# Patient Record
Sex: Male | Born: 1970 | Race: Black or African American | Hispanic: No | Marital: Single | Smoking: Never smoker
Health system: Southern US, Community
[De-identification: ages and names within clinical notes are randomized; demographics above are authoritative.]

## PROBLEM LIST (undated history)

## (undated) DIAGNOSIS — F32A Depression, unspecified: Secondary | ICD-10-CM

## (undated) DIAGNOSIS — J449 Chronic obstructive pulmonary disease, unspecified: Secondary | ICD-10-CM

## (undated) DIAGNOSIS — F419 Anxiety disorder, unspecified: Secondary | ICD-10-CM

## (undated) DIAGNOSIS — F329 Major depressive disorder, single episode, unspecified: Secondary | ICD-10-CM

## (undated) DIAGNOSIS — E785 Hyperlipidemia, unspecified: Secondary | ICD-10-CM

## (undated) DIAGNOSIS — H547 Unspecified visual loss: Secondary | ICD-10-CM

## (undated) DIAGNOSIS — I739 Peripheral vascular disease, unspecified: Secondary | ICD-10-CM

## (undated) DIAGNOSIS — I10 Essential (primary) hypertension: Secondary | ICD-10-CM

## (undated) DIAGNOSIS — Z5189 Encounter for other specified aftercare: Secondary | ICD-10-CM

## (undated) DIAGNOSIS — M810 Age-related osteoporosis without current pathological fracture: Secondary | ICD-10-CM

## (undated) DIAGNOSIS — Z8673 Personal history of transient ischemic attack (TIA), and cerebral infarction without residual deficits: Secondary | ICD-10-CM

## (undated) DIAGNOSIS — Z89619 Acquired absence of unspecified leg above knee: Secondary | ICD-10-CM

## (undated) DIAGNOSIS — M1990 Unspecified osteoarthritis, unspecified site: Secondary | ICD-10-CM

## (undated) DIAGNOSIS — I7100 Dissection of unspecified site of aorta: Secondary | ICD-10-CM

## (undated) DIAGNOSIS — N189 Chronic kidney disease, unspecified: Secondary | ICD-10-CM

## (undated) HISTORY — DX: Chronic kidney disease, unspecified: N18.9

## (undated) HISTORY — DX: Unspecified visual loss: H54.7

## (undated) HISTORY — PX: ABDOMINAL SURGERY: SHX537

## (undated) HISTORY — DX: Encounter for other specified aftercare: Z51.89

## (undated) HISTORY — DX: Chronic obstructive pulmonary disease, unspecified: J44.9

## (undated) HISTORY — DX: Peripheral vascular disease, unspecified: I73.9

## (undated) HISTORY — DX: Acquired absence of unspecified leg above knee: Z89.619

## (undated) HISTORY — DX: Age-related osteoporosis without current pathological fracture: M81.0

## (undated) HISTORY — DX: Unspecified osteoarthritis, unspecified site: M19.90

## (undated) HISTORY — DX: Hyperlipidemia, unspecified: E78.5

## (undated) HISTORY — PX: OTHER SURGICAL HISTORY: SHX169

## (undated) HISTORY — DX: Dissection of unspecified site of aorta: I71.00

## (undated) HISTORY — DX: Personal history of transient ischemic attack (TIA), and cerebral infarction without residual deficits: Z86.73

---

## 2007-09-10 DIAGNOSIS — N2581 Secondary hyperparathyroidism of renal origin: Secondary | ICD-10-CM | POA: Insufficient documentation

## 2008-05-27 DIAGNOSIS — H5440 Blindness, one eye, unspecified eye: Secondary | ICD-10-CM | POA: Insufficient documentation

## 2008-06-09 DIAGNOSIS — G8220 Paraplegia, unspecified: Secondary | ICD-10-CM | POA: Insufficient documentation

## 2011-05-20 ENCOUNTER — Ambulatory Visit (HOSPITAL_COMMUNITY)
Admission: RE | Admit: 2011-05-20 | Discharge: 2011-05-20 | Disposition: A | Discharge status: Home / Self Care | Payer: Medicare Other | Source: Ambulatory Visit | Attending: Family Medicine | Admitting: Family Medicine

## 2011-05-20 DIAGNOSIS — S78119A Complete traumatic amputation at level between unspecified hip and knee, initial encounter: Secondary | ICD-10-CM | POA: Insufficient documentation

## 2011-05-20 DIAGNOSIS — R29898 Other symptoms and signs involving the musculoskeletal system: Secondary | ICD-10-CM | POA: Insufficient documentation

## 2011-05-20 DIAGNOSIS — Z5189 Encounter for other specified aftercare: Secondary | ICD-10-CM | POA: Insufficient documentation

## 2011-05-20 DIAGNOSIS — M24559 Contracture, unspecified hip: Secondary | ICD-10-CM | POA: Insufficient documentation

## 2011-05-20 DIAGNOSIS — R269 Unspecified abnormalities of gait and mobility: Secondary | ICD-10-CM | POA: Insufficient documentation

## 2011-05-20 DIAGNOSIS — M2560 Stiffness of unspecified joint, not elsewhere classified: Secondary | ICD-10-CM | POA: Insufficient documentation

## 2011-05-20 DIAGNOSIS — M6281 Muscle weakness (generalized): Secondary | ICD-10-CM | POA: Insufficient documentation

## 2011-05-20 NOTE — Progress Notes (Signed)
Physical Therapy Evaluation  Patient Details  Name: Robert Campbell MRN: [id number] Date of Birth: September 14, 1970  Today's Date: 05/20/2011 Time: [id number] Time Calculation (min): 88 min Visit#: 1  of 24   Re-eval: 06/19/11 Assessment Diagnosis: B AKA Surgical Date: 09/17/07 Prior Therapy: 9/09-12/09 Charge:  Evaluation; there ex x 42 Past Medical History: No past medical history on file. Past Surgical History: No past surgical history on file.  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  He was in the hospital for 9 months.  He still had a wound in his R stump from the amputation and was referred to the wound center for his wound care.  The patient states that he was at the wound center for a year and a half. 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 He had an antiseptic rod put in last October.   The patient was fitted for his prothesis a month and a half ago at  [facility] telephone [phone] fax [phone].   He states he was able to stand on his prosthetic legs for a short period of time.  He is now being referred to physical therapy to learn how to get up on his prosthetic legs.   How long can you sit comfortably?: The patient is in a wheelchair. How long can you stand comfortably?: unable How long can you walk comfortably?: unable. Pain Assessment Currently in Pain?: No/denies (Pain with the shrinkers.)  Precautions/Restrictions  Precautions Precautions: Fall Required Braces or Orthoses: Yes Other Brace/Splint: B prothesis Restrictions Weight Bearing Restrictions: No  Prior Functioning  Home Living Lives With: Family (grandmother) Receives Help From: Home health Type of Home: House Home Layout: One level Home Access: Level entry Bathroom Shower/Tub:  (Pt completes bed  baths.) Bathroom Toilet:  (bedside commode  and urinal.) Home Adaptive Equipment: Bedside commode/3-in-1;Electric Scooter;Hospital bed;Wheelchair - powered  Sensation/Coordination/Flexibility    Assessment RLE AROM (degrees) Right Hip Extension 0-30:  (PROM 8 degrees away from neutral) Right Hip Flexion 0-125: 15  (PROM 40 ) Right Hip ABduction 0-45: 0  (PROM 5) Right Hip ADduction 0-25: 0  RLE Strength Right Hip Flexion: 1/5 Right Hip Extension: 1/5 Right Hip ABduction: 1/5 Right Hip ADduction: 1/5 LLE AROM (degrees) Left Hip Extension 0-30:  (Passively 20 degrees away from neutral.) Left Hip Flexion 0-125: 25  (Passive 60 degrees) Left Hip ABduction 0-45: 0  (PROM 10) Left Hip ADduction 0-25: 0  LLE Strength Left Hip Flexion: 1/5 Left Hip Extension: 1/5 Left Hip ABduction: 2-/5 ( 2- in available range) Left Hip ADduction: 1/5  Exercise/Treatments    PT received PROM to B hips in all directions;   Supine AA hip flexion x 5 concentrating on being able to lift opposite hip without bringing the other hip into flexion as well, adduction x 5, and abduction.  side-lying hip extension.  Physical Therapy Assessment and Plan PT Assessment and Plan Clinical Impression Statement: Pt with significant contractures and weakness making using prothesis impractical at this time. Pt will benefit from skilled PT to improve the above and begin prosthetic training. Rehab Potential: Good Clinical Impairments Affecting Rehab Potential: contractures, weakness. PT Frequency: Min 3X/week PT Duration: 8 weeks PT Treatment/Interventions: Gait training;Therapeutic exercise;Patient/family education;Balance training PT Plan: work on bed mobility, transfers, PROM, strengthenging  Goals Home Exercise Program Pt will Perform Home Exercise Program: Independently PT Short Term Goals Time to Complete Short Term Goals: 2 weeks PT Short Term Goal 1: increase ROM by 10 degrees PT Short Term Goal 2:  increase strength by 1/2 grade. PT Long Term Goals Time to Complete Long Term Goals: 8 weeks PT Long Term Goal 1: Increase mm strength by one grade. PT Long Term Goal 2: increase ROM by 20- 30 degrees. Long Term Goal 3: able to come up onto prosthetic with min assist Long Term Goal 4: able to walk 30 ft with walker and min assist.  Problem List Patient Active Problem List  Diagnoses  . Stiffness of joint, not elsewhere classified, multiple sites  . Weakness of both legs  . Contracture of hip joint    PT - End of Session Equipment Utilized During Treatment: Gait belt Activity Tolerance: Patient tolerated treatment well General Behavior During Session: [facility] for tasks performed Cognition: [facility] for tasks performed   RUSSELL,[name] 05/20/2011, 11:39 AM  Physician Documentation Your signature is required to indicate approval of the treatment plan as stated above.  Please sign and either send electronically or make a copy of this report for your files and return this physician signed original.   Please mark one 1.__approve of plan  2. ___approve of plan with the following conditions.   ______________________________                                                          _____________________ Physician Signature                                                                                                             Date

## 2011-05-23 ENCOUNTER — Ambulatory Visit (HOSPITAL_COMMUNITY)
Admission: RE | Admit: 2011-05-23 | Discharge: 2011-05-23 | Disposition: A | Discharge status: Home / Self Care | Payer: Medicare Other | Source: Ambulatory Visit | Attending: Family Medicine | Admitting: Family Medicine

## 2011-05-23 NOTE — Progress Notes (Signed)
Physical Therapy Treatment Patient Details  Name: Robert Campbell MRN: [id number] Date of Birth: [birth year]  Today's Date: 05/23/2011 Time: [id number] Time Calculation (min): 64 min Visit#: 2  of 24   Re-eval: 06/19/11 Charges: Therex x 54'  Subjective: Symptoms/Limitations Symptoms: Pt reprots that he was a little sore after last session but not too bad. Pain Assessment Currently in Pain?: No/denies  Exercise/Treatments Hip Exercises Straight Leg Raises: AAROM;10 reps Hip Extension: 10 reps;AAROM Hip ABduction/ADduction: 10 reps;AAROM POE x 3' PROM to B hips all directions 3x30" each  Physical Therapy Assessment and Plan PT Assessment and Plan Clinical Impression Statement: Pt requires manual assistance to disassociate LE with therex. Pt with increased wt shift to R side with prone therex secondary to L hip flexion contracture. Pt requires assistance to complete all LE exercises. PROM performed to B hips in all directions to reduce contractures. Pt requires multimodal cueing to use hip mm rather than trunk mm to move LE. PT Treatment/Interventions: Therapeutic exercise PT Plan: Contionue to progress per PT POC.     Problem List Patient Active Problem List  Diagnoses  . Stiffness of joint, not elsewhere classified, multiple sites  . Weakness of both legs  . Contracture of hip joint    PT - End of Session Activity Tolerance: Patient tolerated treatment well General Behavior During Session: [facility] for tasks performed Cognition: [facility] for tasks performed  [name] 05/23/2011, 12:20 PM

## 2011-05-25 ENCOUNTER — Ambulatory Visit (HOSPITAL_COMMUNITY)
Admission: RE | Admit: 2011-05-25 | Discharge: 2011-05-25 | Disposition: A | Discharge status: Home / Self Care | Payer: Medicare Other | Source: Ambulatory Visit | Attending: Family Medicine | Admitting: Family Medicine

## 2011-05-25 NOTE — Progress Notes (Signed)
Physical Therapy Treatment Patient Details  Name: Robert Campbell MRN: [id number] Date of Birth: Dec 12, 1970  Today's Date: 05/25/2011 Time: [id number] Time Calculation (min): 57 min Visit#: 3  of 24   Re-eval: 06/19/11 Charges: Therex x 45' There act x 8'  Subjective: Symptoms/Limitations Symptoms: "I was a little sore after last time but I know I need it." Pain Assessment Currently in Pain?: No/denies   Mobility (including Balance) Bed Mobility Bed Mobility: Yes (Completed 5 times B to increase strength) Rolling Right: 4: Min assist;7: Independent Rolling Right Details (indicate cue type and reason): Pt requires min assist for first 2 tries then able to roll R independently. Rolling Left: 7: Independent     Exercise/Treatments Supine Straight Leg Raises: 10 reps;Both;AAROM Other Supine Knee Exercises: PROM to B hips abd and flexion Sidelying Hip ABduction: Both;15 reps Other Sidelying Knee Exercises: PROM to B for ext Prone  Hip Extension: 15 reps;Both Other Prone Exercises: POE x 3' at start of session Other Prone Exercises: Glute set 10x5"  Physical Therapy Assessment and Plan PT Assessment and Plan Clinical Impression Statement: Pt displyas increased strength and ROM this tx. Pt displays increased hip stability with sidelying exercises. Pt able to roll R independtently after 2 tries with min assist. PT Treatment/Interventions: Therapeutic exercise PT Plan: Continue to progress per PT POC.    Goals    Problem List Patient Active Problem List  Diagnoses  . Stiffness of joint, not elsewhere classified, multiple sites  . Weakness of both legs  . Contracture of hip joint    PT - End of Session Activity Tolerance: Patient tolerated treatment well General Behavior During Session: [facility] for tasks performed Cognition: [facility] for tasks performed  Robert Campbell 05/25/2011, 12:18 PM

## 2011-05-27 ENCOUNTER — Ambulatory Visit (HOSPITAL_COMMUNITY)
Admission: RE | Admit: 2011-05-27 | Discharge: 2011-05-27 | Disposition: A | Discharge status: Home / Self Care | Payer: Medicare Other | Source: Ambulatory Visit | Attending: Physical Therapy | Admitting: Physical Therapy

## 2011-05-27 DIAGNOSIS — R29898 Other symptoms and signs involving the musculoskeletal system: Secondary | ICD-10-CM

## 2011-05-27 DIAGNOSIS — M24559 Contracture, unspecified hip: Secondary | ICD-10-CM

## 2011-05-27 DIAGNOSIS — M2560 Stiffness of unspecified joint, not elsewhere classified: Secondary | ICD-10-CM

## 2011-05-27 NOTE — Progress Notes (Signed)
Physical Therapy Treatment Patient Details  Name: Robert Campbell MRN: [id number] Date of Birth: Feb 12, 1971  Today's Date: 05/27/2011 Time: 1102-1211 Time Calculation (min): 17 min Visit#: 4  of 24   Re-eval: 06/19/11    Subjective: Symptoms/Limitations Symptoms: Pt states he can tell that he is making progress. Pain Assessment Currently in Pain?: No/denies  Exercise/Treatments Supine Quad Sets: Limitations Quad Sets Limitations: Rolling R/L x 10 @; ab curl x 10 Straight Leg Raises: 10 reps;Both;AAROM Other Supine Knee Exercises: PROM to B hips abd and flexion Other Supine Knee Exercises: AAROM for flex/ext x 10 Sidelying Hip ABduction: Both;15 reps Hip ADduction: Both;10 reps Other Sidelying Knee Exercises: PROM to B for ext Other Sidelying Knee Exercises: AROM for flex/ext B Prone  Hip Extension: 15 reps;Both Other Prone Exercises: Press ups x 10 Other Prone Exercises: AAHip extension x 10@   Physical Therapy Assessment and Plan PT Assessment and Plan Clinical Impression Statement: Pt continues to display more ROM and strength; added rolling for obliques and ab curl for rectus able to do I now.  PT Plan: continue to progress;  begin sitting balance activities.    Goals    Problem List Patient Active Problem List  Diagnoses  . Stiffness of joint, not elsewhere classified, multiple sites  . Weakness of both legs  . Contracture of hip joint    PT - End of Session Activity Tolerance: Patient tolerated treatment well General Behavior During Session: [facility] for tasks performed Cognition: [facility] for tasks performed  RUSSELL,[name] 05/27/2011, 12:35 PM

## 2011-05-30 ENCOUNTER — Ambulatory Visit (HOSPITAL_COMMUNITY): Payer: Medicare Other | Admitting: *Deleted

## 2011-06-01 ENCOUNTER — Ambulatory Visit (HOSPITAL_COMMUNITY)
Admission: RE | Admit: 2011-06-01 | Discharge: 2011-06-01 | Disposition: A | Discharge status: Home / Self Care | Payer: Medicare Other | Source: Ambulatory Visit | Attending: Physical Therapy | Admitting: Physical Therapy

## 2011-06-01 ENCOUNTER — Ambulatory Visit (HOSPITAL_COMMUNITY)
Admission: RE | Admit: 2011-06-01 | Discharge: 2011-06-01 | Disposition: A | Discharge status: Home / Self Care | Payer: Medicare Other | Source: Ambulatory Visit | Attending: Specialist | Admitting: Specialist

## 2011-06-01 DIAGNOSIS — R6889 Other general symptoms and signs: Secondary | ICD-10-CM | POA: Insufficient documentation

## 2011-06-01 NOTE — Progress Notes (Signed)
Occupational Therapy Evaluation  Patient Details  Name: Robert Campbell MRN: [id number] Date of Birth: 1970-11-08  Today's Date: 06/01/2011 Time: [id number] Time Calculation (min): 20 min OT Eval 20' Visit#: 1  of 8   Re-eval: 06/29/11  Assessment Diagnosis: B AKA Next MD Visit: unknown Prior Therapy: no  Past Medical History: No past medical history on file. Past Surgical History: No past surgical history on file.  Subjective Symptoms/Limitations Symptoms: S:  I havent been able to get into the shower for 3 years. 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  He was in the hospital for 9 months.  He still had a wound in his R stump from the amputation and was referred to the wound center for his wound care.  The patient states that he was at the wound center for a year and a half. 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 He had an antiseptic rod put in last October.   The patient was fitted for his prothesis a month and a half ago at  the [facility] telephone [phone] fax [phone].   He states he was able to stand on his prosthetic legs for a short period of time.   Currently, he completes all BADLs from his bed with the assistance of a CNA.  He would like to be able to use his prosthetics and have greater I with his BADLs out of his bed. Pain Assessment Currently in Pain?: No/denies  Precautions/Restrictions  Precautions Precautions: Fall Required Braces or Orthoses: Yes Other Brace/Splint: B prothesis Restrictions Weight Bearing Restrictions: No  Prior Functioning  Home Living Lives With: Family (grandmother) Receives Help From: Personal care attendant (2.5 hours a day) Type of Home: House Home Layout: One level Home Access: Level entry Bathroom Shower/Tub:  (not accessible by  wheelchair) Bathroom Toilet:  (not accessible by wheelchair) Bathroom Accessibility: No Home Adaptive Equipment: Bedside commode/3-in-1;Hospital bed;Wheelchair - powered Prior Function Level of Independence: Independent with basic ADLs;Independent with homemaking with ambulation;Independent with transfers;Independent with gait (prior to amputation 3 years ago, since that time max pa) Able to Take Stairs?: No Driving: No Vocation: On disability  Assessment ADL Eating/Feeding: Independent Grooming: Set up (completes in bed) Upper Body Bathing: Simulated;Moderate assistance (he does front of body and peri area, aide does posterior bod) Upper Body Bathing Details (indicate cue type and reason): completes in bed Lower Body Bathing: Moderate assistance (he completes front of body, aide completes posterior) Where Assessed - Lower Body Bathing:  (completes in bed) Upper Body Dressing: Simulated;Set up Lower Body Dressing: Simulated;Set up Toileting - Clothing Manipulation: Simulated (completes pant pull in bed) Toileting - Hygiene: Set up ADL Comments:  (completes all ADLs in bed, unable to use shower or toilet) Vision - History Baseline Vision: No visual deficits Perception Perception: Within Functional Limits Praxis Praxis: Intact Cognition Overall Cognitive Status: Appears within functional limits for tasks assessed Arousal/Alertness: Awake/alert Orientation Level: Oriented X4 RUE Assessment RUE Assessment: Within Functional Limits RUE AROM (degrees) Overall AROM Right Upper Extremity: Within functional limits for tasks performed RUE Strength RUE Overall Strength: Within Functional Limits for tasks performed (5/5 strength but fatigues quickly) LUE Assessment LUE Assessment: Within Functional Limits LUE Strength LUE Overall Strength Comments: 5/5 strength but fatigues easily.  Occupational Therapy Assessment and Plan OT Assessment and Plan  Clinical Impression Statement: A:   Patient is completing all BADLs with assistance at bed level.  He will benefit from skilled OT intervention to improve his independence level to complete ADLs from wc level and with use of prosthetics. Rehab Potential: Good OT Frequency: Min 2X/week OT Duration: 4 weeks OT Treatment/Interventions: Self-care/ADL training;Therapeutic exercise;Patient/family education;Therapeutic activities (HEP:  Shake weight exercises to build upper body act tol.) OT Plan: P:  Skilled OT intervention to increase I with all BADLs, increase activity tolerance.  Treatment Plan:  Tband, wall wash, strengthening with free weights entire upper body, tub transfer, ADL retraining, cooking from wc level.   Goals Short Term Goals Time to Complete Short Term Goals: 2 weeks Short Term Goal 1: Patient will be educated on a home exercise program. Short Term Goal 2: Patient will complete tub transfer with tub transfer bench with mod pa. Short Term Goal 3: Patient will increase BUE sustained activity tolerance from fair + to good +. Short Term Goal 4: Patient will complete BADLs at wheelchair level 50% of the time. Long Term Goals Time to Complete Long Term Goals: 4 weeks Long Term Goal 1: Patient will increase BUE sustained activity tolerance to normal for increased independence with BADLs. Long Term Goal 2: Patient will complete tub transfer with tub transfer bench with SBA. Long Term Goal 3: Patient will complete ADLs from wheelchair level 80% of the time. Long Term Goal 4: Patient will don/doff prosthetics with min pa. Long Term Goal 5: Patient will utilize prosthetics to ambulate into bathroom for tub and toilet transfer with min pa. End of Session Patient Active Problem List  Diagnoses  . Stiffness of joint, not elsewhere classified, multiple sites  . Weakness of both legs  . Contracture of hip joint  . Other general symptoms   End of Session Activity Tolerance: Patient tolerated treatment well General Behavior  During Session: [facility] for tasks performed Cognition: [facility] for tasks performed  Time Calculation Start Time: [id number] Stop Time: 1107 Time Calculation (min): 20 min  Vangie Bicker, OTR/L  06/01/2011, 11:36 AM  Physician Documentation Your signature is required to indicate approval of the treatment plan as stated above.  Please sign and either send electronically or make a copy of this report for your files and return this physician signed original.  Please mark one 1.__approve of plan  2. ___approve of plan with the following conditions.   ______________________________                                                          _____________________ Physician Signature                                                                                                             Date

## 2011-06-01 NOTE — Progress Notes (Signed)
Physical Therapy Treatment Patient Details  Name: Robert Campbell MRN: [id number] Date of Birth: [birth year]  Today's Date: 06/01/2011 Time: 1110-1210 Time Calculation (min): 60 min Visit#: 5  of 24   Re-eval: 06/19/11 Charges: therex x 55'  Subjective: Symptoms/Limitations Symptoms: Pt reports that he has been doing his exercises at home. Pain Assessment Currently in Pain?: No/denies   Exercise/Treatments Seated Other Seated Knee Exercises: Sitting with UE assist 2x 1' Supine Straight Leg Raises: 15 reps;Both;AAROM Other Supine Knee Exercises: PROM to B hips abd and flexion Sidelying Hip ABduction: Both;15 reps Other Sidelying Knee Exercises: PROM to B for ext Prone  Hip Extension: 15 reps;Both Other Prone Exercises: Press ups x 10 Other Prone Exercises: POE x 3'   Physical Therapy Assessment and Plan PT Assessment and Plan Clinical Impression Statement: Pt continues to display gains in ROM and strength. Pt displays increased hip stability with SL exercises. Began sitting with UE assist to improve core stability. PT Treatment/Interventions: Therapeutic exercise PT Plan: Continue to progress per PT POC.     Problem List Patient Active Problem List  Diagnoses  . Stiffness of joint, not elsewhere classified, multiple sites  . Weakness of both legs  . Contracture of hip joint  . Other general symptoms    PT - End of Session Activity Tolerance: Patient tolerated treatment well General Behavior During Session: [facility] for tasks performed Cognition: [facility] for tasks performed  [name] 06/01/2011, 12:24 PM

## 2011-06-03 ENCOUNTER — Ambulatory Visit (HOSPITAL_COMMUNITY)
Admission: RE | Admit: 2011-06-03 | Discharge: 2011-06-03 | Disposition: A | Discharge status: Home / Self Care | Payer: Medicare Other | Source: Ambulatory Visit | Attending: Family Medicine | Admitting: Family Medicine

## 2011-06-03 NOTE — Progress Notes (Signed)
Physical Therapy Treatment Patient Details  Name: Geomar Toye MRN: [id number] Date of Birth: March 29, 1971  Today's Date: 06/03/2011 Time: [id number] Time Calculation (min): 62 min Visit#: 6  of 24   Re-eval: 06/20/11   charge:  There ex 56 Subjective: Symptoms/Limitations Symptoms: I'm sore Pain Assessment Currently in Pain?: No/denies  Exercise/Treatments Stretches   manual stretch for B hip in all directions   Seated Other Seated Knee Exercises: Sitting with UE assist 2x 1' Supine Straight Leg Raises: 15 reps;Both;AAROM Other Supine Knee Exercises: PROM to B hips abd and flexion Other Supine Knee Exercises: 20 straight ab crunch; 10 diagonal both r and l Sidelying Hip ABduction: Both;10 reps Hip ADduction: 10 reps Other Sidelying Knee Exercises: PROM to B for ext Other Sidelying Knee Exercises: hip flex/ext x 10 Prone  Hip Extension: 15 reps;Both Other Prone Exercises: Press ups x 10 Other Prone Exercises: POE x 3'   Physical Therapy Assessment and Plan PT Assessment and Plan Clinical Impression Statement: Pt continues to make small gains but will nees skilled physical therapy for prolong period of time.   Rehab Potential: Good PT Frequency: Min 3X/week PT Plan: Work on sit ups with therapist in front assisting with hands.  Give patient [name]'s number as he will go beyond the medicare cap.    Goals  good sitting balance; ability to don prothesis and walk in house.  Problem List Patient Active Problem List  Diagnoses  . Stiffness of joint, not elsewhere classified, multiple sites  . Weakness of both legs  . Contracture of hip joint  . Other general symptoms    PT - End of Session Activity Tolerance: Patient tolerated treatment well General Behavior During Session: [facility] for tasks performed Cognition: [facility] for tasks performed  RUSSELL,[name] 06/03/2011, 1:28 PM

## 2011-06-06 ENCOUNTER — Ambulatory Visit (HOSPITAL_COMMUNITY)
Admission: RE | Admit: 2011-06-06 | Discharge: 2011-06-06 | Disposition: A | Discharge status: Home / Self Care | Payer: Medicare Other | Source: Ambulatory Visit | Attending: Family Medicine | Admitting: Family Medicine

## 2011-06-06 DIAGNOSIS — R6889 Other general symptoms and signs: Secondary | ICD-10-CM

## 2011-06-06 DIAGNOSIS — M24559 Contracture, unspecified hip: Secondary | ICD-10-CM

## 2011-06-06 DIAGNOSIS — R29898 Other symptoms and signs involving the musculoskeletal system: Secondary | ICD-10-CM

## 2011-06-06 DIAGNOSIS — M2560 Stiffness of unspecified joint, not elsewhere classified: Secondary | ICD-10-CM

## 2011-06-06 NOTE — Progress Notes (Signed)
Physical Therapy Treatment Patient Details  Name: Birch Schetter MRN: [id number] Date of Birth: Dec 23, 1970  Today's Date: 06/06/2011 Time: 0923-1020 Time Calculation (min): 57 min Visit#: 7  of 24   Re-eval: 06/20/11  There ex 55  Subjective: Symptoms/Limitations Symptoms: I am still sore but it's okay, I know that I need it.; Pain Assessment Currently in Pain?: Yes   Exercise/Treatments   Seated Other Seated Knee Exercises: Sitting with UE assist 2x 1' Other Seated Knee Exercises: pull up supine-1/2 sit using therapists hands. Supine Hip Adduction Isometric: Limitations Hip Adduction Isometric Limitations: supine AA AB/Adductionx 10 Straight Leg Raises: 15 reps;Both;AAROM Other Supine Knee Exercises: PROM to B hips abd and flexion Other Supine Knee Exercises: 20 straight ab crunch; 10 diagonal both r and l Sidelying Hip ABduction: Both;15 reps Hip ADduction: 10 reps Other Sidelying Knee Exercises: PROM to B for ext Other Sidelying Knee Exercises: hip flex/ext x 10 Prone  Hip Extension: 15 reps;Both Contract/Relax to Increase Flexion: PROM for hip extension Other Prone Exercises: Press ups x 10 Other Prone Exercises: POE x 3'      Physical Therapy Assessment and Plan PT Assessment and Plan Clinical Impression Statement: Pt continues to be motivated and continues to slowly gain ROM and strength PT Plan: continue with current treatment.  Give pt [name]'s number as he will go beyong the medicare cap    Goals    Problem List Patient Active Problem List  Diagnoses  . Stiffness of joint, not elsewhere classified, multiple sites  . Weakness of both legs  . Contracture of hip joint  . Other general symptoms    PT - End of Session Activity Tolerance: Patient tolerated treatment well General Behavior During Session: [facility] for tasks performed Cognition: [facility] for tasks performed  RUSSELL,[name] 06/06/2011, 10:29 AM

## 2011-06-08 ENCOUNTER — Ambulatory Visit (HOSPITAL_COMMUNITY)
Admission: RE | Admit: 2011-06-08 | Discharge: 2011-06-08 | Disposition: A | Discharge status: Home / Self Care | Payer: Medicare Other | Source: Ambulatory Visit | Attending: Family Medicine | Admitting: Family Medicine

## 2011-06-08 NOTE — Progress Notes (Addendum)
Physical Therapy Treatment Patient Details  Name: Robert Campbell MRN: [id number] Date of Birth: 08-14-1970  Today's Date: 06/08/2011 Time: [id number] Time Calculation (min): 63 min Visit#: 8  of 24   Re-eval: 06/20/11 Charges: Therex x 45' There act x 10'  Subjective: Symptoms/Limitations Symptoms: Pt reports HEP compliance Pain Assessment Currently in Pain?: No/denies   Exercise/Treatments Seated Other Seated Knee Exercises: Sitting with UE assist 2x 1' Other Seated Knee Exercises: pull up supine-1/2 sit using therapists 5# dowel rod. Supine Straight Leg Raises: Both;AAROM;10 reps Other Supine Knee Exercises: PROM to B hips abd and flexion Other Supine Knee Exercises: 20 straight ab crunch; 10 diagonal both r and l Sidelying Hip ABduction: Both;15 reps Hip ADduction: 10 reps Other Sidelying Knee Exercises: PROM to B for ext Other Sidelying Knee Exercises: Rolling R/L x 5 Prone  Hip Extension: 15 reps;Both Other Prone Exercises: Press ups x 10 Other Prone Exercises: POE x 3'   Physical Therapy Assessment and Plan PT Assessment and Plan Clinical Impression Statement: Pt continues to show improvement. Pt displays increased core strength with rolling. Pt displays increased hip stability and abductor strength with SL exercise. Pt advised to limit use of trapeze at home to facilitate use core mm. PT Treatment/Interventions: Therapeutic exercise;Functional mobility training PT Plan: Continue per PT POC. Give pt [name]'s number as he will go beyond the medicare cap.     Problem List Patient Active Problem List  Diagnoses  . Stiffness of joint, not elsewhere classified, multiple sites  . Weakness of both legs  . Contracture of hip joint  . Other general symptoms    PT - End of Session Activity Tolerance: Patient tolerated treatment well General Behavior During Session: [facility] for tasks performed Cognition: [facility] for tasks performed  [name] 06/08/2011, 11:10 AM

## 2011-06-08 NOTE — Patient Instructions (Addendum)
Con

## 2011-06-13 ENCOUNTER — Ambulatory Visit (HOSPITAL_COMMUNITY)
Admission: RE | Admit: 2011-06-13 | Discharge: 2011-06-13 | Disposition: A | Discharge status: Home / Self Care | Payer: Medicare Other | Source: Ambulatory Visit | Attending: Family Medicine | Admitting: Family Medicine

## 2011-06-13 DIAGNOSIS — M2560 Stiffness of unspecified joint, not elsewhere classified: Secondary | ICD-10-CM

## 2011-06-13 DIAGNOSIS — R29898 Other symptoms and signs involving the musculoskeletal system: Secondary | ICD-10-CM

## 2011-06-13 DIAGNOSIS — R6889 Other general symptoms and signs: Secondary | ICD-10-CM

## 2011-06-13 NOTE — Progress Notes (Signed)
Occupational Therapy Treatment  Patient Details  Name: Robert Campbell MRN: [id number] Date of Birth: [birth year]  Today's Date: 06/13/2011 Time: 1031-1100 Time Calculation (min): 29 min Therapeutic Exercises 29' Visit#: 2  of 8   Re-eval: 06/29/11    Subjective Symptoms/Limitations Symptoms: S:  I have been doing the shake weight at home. Pain Assessment Currently in Pain?: No/denies  O:  Exercise/Treatments Seated Protraction: Strengthening;15 reps Protraction Weight (lbs): 2# Horizontal ABduction: Strengthening;15 reps Horizontal ABduction Weight (lbs): 2# External Rotation: Strengthening;15 reps External Rotation Weight (lbs): 2# Internal Rotation: Strengthening;15 reps Internal Rotation Weight (lbs): 2# Flexion: Strengthening;15 reps Flexion Weight (lbs): 2# Abduction: Strengthening;15 reps ABduction Weight (lbs): 2# Other Seated Exercises: Elbow flex/ext, wrist flex/ext x 15 reps with 2# Other Seated Exercises: green tband ext, ret, row 15 reps   ROM / Strengthening / Isometric Strengthening "W" Arms: 2# x 15 reps X to V Arms: 2# x 15 reps Proximal Shoulder Strengthening, Seated: 20 reps each with 2#, rest after 2 exercises  Other ROM/Strengthening Exercises: begin throw/catch with weighted ball next visit      Occupational Therapy Assessment and Plan OT Assessment and Plan Clinical Impression Statement: A:  Fatigued easily with upper body strengthening exercises.  Unable to complete wall wash due to not being able to flex at waist to reach wall. OT Plan: P:  Increase tolerance with exercises, attempt tub transfer.   Goals Short Term Goals Time to Complete Short Term Goals: 2 weeks Short Term Goal 1: Patient will be educated on a home exercise program. Short Term Goal 1 Progress: Progressing toward goal Short Term Goal 2: Patient will complete tub transfer with tub transfer bench with mod pa. Short Term Goal 2 Progress: Progressing toward goal Short Term  Goal 3: Patient will increase BUE sustained activity tolerance from fair + to good +. Short Term Goal 3 Progress: Progressing toward goal Short Term Goal 4: Patient will complete BADLs at wheelchair level 50% of the time. Short Term Goal 4 Progress: Progressing toward goal Long Term Goals Time to Complete Long Term Goals: 4 weeks Long Term Goal 1: Patient will increase BUE sustained activity tolerance to normal for increased independence with BADLs. Long Term Goal 1 Progress: Progressing toward goal Long Term Goal 2: Patient will complete tub transfer with tub transfer bench with SBA. Long Term Goal 2 Progress: Progressing toward goal Long Term Goal 3: Patient will complete ADLs from wheelchair level 80% of the time. Long Term Goal 3 Progress: Progressing toward goal Long Term Goal 4: Patient will don/doff prosthetics with min pa. Long Term Goal 5: Patient will utilize prosthetics to ambulate into bathroom for tub and toilet transfer with min pa. Long Term Goal 5 Progress: Progressing toward goal End of Session Patient Active Problem List  Diagnoses  . Stiffness of joint, not elsewhere classified, multiple sites  . Weakness of both legs  . Contracture of hip joint  . Other general symptoms   End of Session Activity Tolerance: Patient tolerated treatment well General Behavior During Session: [facility] for tasks performed Cognition: [facility] for tasks performed   Vangie Bicker, OTR/L  06/13/2011, 11:03 AM

## 2011-06-13 NOTE — Progress Notes (Signed)
Physical Therapy Treatment Patient Details  Name: Robert Campbell MRN: [id number] Date of Birth: Sep 10, 1970  Today's Date: 06/13/2011 Time: [id number] Time Calculation (min): 49 min Visit#: 9  of 24   Re-eval: 06/20/11 Charges: Therex x 45'  Subjective: Symptoms/Limitations Symptoms: Pt reports that it is hard to do his exercises at home because he has a foam mattress. Pt reports that he had some throbbing in his right leg this morning but is subsided once he got up. "I slept on it wrong" Pain Assessment Currently in Pain?: No/denies  Exercise/Treatments Supine Straight Leg Raises: Both;AAROM;10 reps Other Supine Knee Exercises: PROM to B hips abd and flexion Sidelying Hip ABduction: Both;15 reps Other Sidelying Knee Exercises: PROM to B for ext Prone  Hip Extension: 15 reps;Both Other Prone Exercises: Press ups x 15 Other Prone Exercises: POE x 3'   Physical Therapy Assessment and Plan PT Assessment and Plan Clinical Impression Statement: Pt requires decreased assist ance with SLR this session. Pt encouraged to complete the exercises that he can at home. Pt educated on importance of doing exercises at home. Spoke with pt about easiest ways to complete exercises at home. Pt continues to display increases in ROM and strength. PT Plan: Give pt Robert Campbell's number next tx.     Problem List Patient Active Problem List  Diagnoses  . Stiffness of joint, not elsewhere classified, multiple sites  . Weakness of both legs  . Contracture of hip joint  . Other general symptoms    PT - End of Session Activity Tolerance: Patient tolerated treatment well General Behavior During Session: [facility] for tasks performed Cognition: [facility] for tasks performed  Robert Campbell 06/13/2011, 12:59 PM

## 2011-06-15 ENCOUNTER — Ambulatory Visit (HOSPITAL_COMMUNITY)
Admission: RE | Admit: 2011-06-15 | Discharge: 2011-06-15 | Disposition: A | Discharge status: Home / Self Care | Payer: Medicare Other | Source: Ambulatory Visit | Attending: Family Medicine | Admitting: Family Medicine

## 2011-06-15 DIAGNOSIS — R6889 Other general symptoms and signs: Secondary | ICD-10-CM

## 2011-06-15 DIAGNOSIS — R29898 Other symptoms and signs involving the musculoskeletal system: Secondary | ICD-10-CM

## 2011-06-15 DIAGNOSIS — M2560 Stiffness of unspecified joint, not elsewhere classified: Secondary | ICD-10-CM

## 2011-06-15 NOTE — Progress Notes (Signed)
Physical Therapy Treatment Patient Details  Name: Robert Campbell MRN: [id number] Date of Birth: 11-11-1970  Today's Date: 06/15/2011 Time: [id number] Time Calculation (min): 57 min Visit#: 10  of 24   Re-eval: 06/20/11    Subjective: Symptoms/Limitations Symptoms: Pt states he is continuing to be sore in his LE   Exercise/Treatments   Seated Other Seated Knee Exercises: Pt comes to sitting with hands than puts hands in lap AA Supine Straight Leg Raises: Both;AAROM;10 reps Straight Leg Raise with External Rotation:  (PROM for hip flexion B) Other Supine Knee Exercises: PROM to B hips abd and flexion Sidelying Hip ABduction: Both;15 reps;Limitations Hip ABduction Limitations: L with 2# wt. Hip ADduction: 10 reps;Limitations Hip ADduction Limitations: L with 2# wt. Other Sidelying Knee Exercises: PROM to B for ext Other Sidelying Knee Exercises: AROM flex/ext B x10 Prone  Hip Extension: 15 reps;Both Contract/Relax to Increase Flexion: PROM for hip extension Other Prone Exercises: Press ups x 15 Other Prone Exercises: POE x 3'      Physical Therapy Assessment and Plan PT Assessment and Plan Clinical Impression Statement: Pt continues to show progress in L LE R LE is slower to gain ROM and strength. PT Plan: Pt to be reassessed next treatment.  Pt will be alright this year for cap.  Will need to look at preauthorizing at the end of Feb. next year.    Goals  Ambulate with prothesis B  Problem List Patient Active Problem List  Diagnoses  . Stiffness of joint, not elsewhere classified, multiple sites  . Weakness of both legs  . Contracture of hip joint  . Other general symptoms    PT - End of Session Activity Tolerance: Patient tolerated treatment well General Behavior During Session: [facility] for tasks performed  RUSSELL,[name] 06/15/2011, 4:18 PM

## 2011-06-15 NOTE — Patient Instructions (Signed)
Begin placing a pillow in between pt's legs to get a long hip adductor stretch.

## 2011-06-15 NOTE — Progress Notes (Signed)
Occupational Therapy Treatment  Patient Details  Name: Robert Campbell MRN: [id number] Date of Birth: 1970/07/21  Today's Date: 06/15/2011 Time: 1021-1050 Time Calculation (min): 29 min Therapeutic Exercises 29' Visit#: 3  of 8   Re-eval:      Subjective Symptoms/Limitations Symptoms: S:  My arms were sore after the other day. Pain Assessment Currently in Pain?: No/denies  Exercise/Treatments  06/15/11 0700  Shoulder Exercises: Seated  Protraction Strengthening;15 reps  Protraction Weight (lbs) 2#  Horizontal ABduction Strengthening;15 reps  Horizontal ABduction Weight (lbs) 2#  External Rotation Strengthening;15 reps  External Rotation Weight (lbs) 2#  Internal Rotation Strengthening;15 reps  Internal Rotation Weight (lbs) 2#  Flexion Strengthening;15 reps  Flexion Weight (lbs) 2#  Abduction Strengthening;15 reps  ABduction Weight (lbs) 2#  Other Seated Exercises Elbow flex/ext, wrist flex/ext x 15 reps with 2#  Other Seated Exercises green tband ext, ret, row 15 reps  Shoulder Exercises: ROM/Strengthening  "W" Arms 2# x 15 reps  X to V Arms 2# x 15 reps  Proximal Shoulder Strengthening, Seated 20 reps each with 2#, rest after 2 exercises   Other ROM/Strengthening Exercises 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  Other ROM/Strengthening Exercises blue tputty flat roll grip      Occupational Therapy Assessment and Plan OT Assessment and Plan Clinical Impression Statement: A:  Patient states that his BUE are strong (despite requiring multiple rest breaks during exercises and muscle soreness) and that he does not wish to continue with OT.  I discussed the reasons continued upper body strengthening, bilateral integration, and sustained activity tolerance exercises will benefit him.  He declined continuing OT services. OT Plan: P:  DC OT services per patient's wishes.     Goals Short Term Goals Time to Complete Short Term Goals: 2 weeks Short Term Goal 1:  Patient will be educated on a home exercise program. Short Term Goal 1 Progress: Met Short Term Goal 2: Patient will complete tub transfer with tub transfer bench with mod pa. Short Term Goal 2 Progress: Not met Short Term Goal 3: Patient will increase BUE sustained activity tolerance from fair + to good +. Short Term Goal 3 Progress: Not met Short Term Goal 4: Patient will complete BADLs at wheelchair level 50% of the time. Short Term Goal 4 Progress: Not met Long Term Goals Time to Complete Long Term Goals: 4 weeks Long Term Goal 1: Patient will increase BUE sustained activity tolerance to normal for increased independence with BADLs. Long Term Goal 1 Progress: Not met Long Term Goal 2: Patient will complete tub transfer with tub transfer bench with SBA. Long Term Goal 2 Progress: Not met Long Term Goal 3: Patient will complete ADLs from wheelchair level 80% of the time. Long Term Goal 3 Progress: Not met Long Term Goal 4: Patient will don/doff prosthetics with min pa. Long Term Goal 5: Patient will utilize prosthetics to ambulate into bathroom for tub and toilet transfer with min pa. Long Term Goal 5 Progress: Not met End of Session Patient Active Problem List  Diagnoses  . Stiffness of joint, not elsewhere classified, multiple sites  . Weakness of both legs  . Contracture of hip joint  . Other general symptoms   End of Session Activity Tolerance: Patient tolerated treatment well General Behavior During Session: [facility] for tasks performed Cognition: [facility] for tasks performed   Vangie Bicker, OTR/L  06/15/2011, 11:04 AM

## 2011-06-17 ENCOUNTER — Ambulatory Visit (HOSPITAL_COMMUNITY)
Admission: RE | Admit: 2011-06-17 | Discharge: 2011-06-17 | Disposition: A | Discharge status: Home / Self Care | Payer: Medicare Other | Source: Ambulatory Visit | Attending: Family Medicine | Admitting: Family Medicine

## 2011-06-17 DIAGNOSIS — M2560 Stiffness of unspecified joint, not elsewhere classified: Secondary | ICD-10-CM

## 2011-06-17 DIAGNOSIS — R29898 Other symptoms and signs involving the musculoskeletal system: Secondary | ICD-10-CM

## 2011-06-17 DIAGNOSIS — R6889 Other general symptoms and signs: Secondary | ICD-10-CM

## 2011-06-17 DIAGNOSIS — M24559 Contracture, unspecified hip: Secondary | ICD-10-CM

## 2011-06-17 NOTE — Patient Instructions (Signed)
Keep pillows between his legs at home.  Pt states he has not been doing this.

## 2011-06-17 NOTE — Progress Notes (Signed)
Physical Therapy Treatment Patient Details  Name: Robert Campbell MRN: [id number] Date of Birth: 1970-08-12  Today's Date: 06/17/2011 Time: [id number] Time Calculation (min): 64 min Visit#: 11  of 24   Re-eval: 06/20/11  there ex x 59'  Subjective: Symptoms/Limitations Symptoms: Pt states soreness is not as bad as it has been.  Pt states he can tell he is not as tight in his hips as his "package" is starting to be in a normal position.  He states that prior to therapy when sitting it would be high secondary to not having room to drop.l  Exercise/Treatments  Seated Other Seated Knee Exercises: Pt comes to sitting with hands than puts hands in lap therapist has back to back have patient try and pull his back off therapists. Supine Straight Leg Raises: Both;AAROM;10 reps Straight Leg Raise with External Rotation:  (PROM for hip flexion B) Other Supine Knee Exercises: PROM to B hips abd and flexion Sidelying Hip ABduction: Both;15 reps;Limitations Hip ABduction Limitations: L with 2# wt. Hip ADduction: 10 reps;Limitations Hip ADduction Limitations: L with 2# wt. Other Sidelying Knee Exercises: PROM to B for ext Other Sidelying Knee Exercises: AROM flex/ext B x12 using two hurdles with sliding board on top for patient to slide leg forward and back. Prone  Hip Extension: 15 reps;Both Contract/Relax to Increase Flexion: PROM for hip extension   Physical Therapy Assessment and Plan PT Assessment and Plan Clinical Impression Statement: Pt with increased hip ext strength in the R LE Rehab Potential: Good PT Plan: reassess next treatment.    Goals  Strength and ROM WFL to allow pt to start prosthetic training.  Problem List Patient Active Problem List  Diagnoses  . Stiffness of joint, not elsewhere classified, multiple sites  . Weakness of both legs  . Contracture of hip joint  . Other general symptoms    PT - End of Session Activity Tolerance: Patient tolerated treatment  well General Behavior During Session: [facility] for tasks performed Cognition: [facility] for tasks performed  RUSSELL,[name] 06/17/2011, 11:04 AM

## 2011-06-20 ENCOUNTER — Ambulatory Visit (HOSPITAL_COMMUNITY)
Admission: RE | Admit: 2011-06-20 | Discharge: 2011-06-20 | Disposition: A | Discharge status: Home / Self Care | Payer: Medicare Other | Source: Ambulatory Visit | Attending: Family Medicine | Admitting: Family Medicine

## 2011-06-20 ENCOUNTER — Ambulatory Visit (HOSPITAL_COMMUNITY): Payer: Medicare Other | Admitting: Physical Therapy

## 2011-06-20 DIAGNOSIS — R269 Unspecified abnormalities of gait and mobility: Secondary | ICD-10-CM | POA: Insufficient documentation

## 2011-06-20 DIAGNOSIS — M6281 Muscle weakness (generalized): Secondary | ICD-10-CM | POA: Insufficient documentation

## 2011-06-20 DIAGNOSIS — Z5189 Encounter for other specified aftercare: Secondary | ICD-10-CM | POA: Insufficient documentation

## 2011-06-20 DIAGNOSIS — S78119A Complete traumatic amputation at level between unspecified hip and knee, initial encounter: Secondary | ICD-10-CM | POA: Insufficient documentation

## 2011-06-20 NOTE — Progress Notes (Signed)
Physical Therapy Re-assessment Patient Details  Name: Robert Campbell MRN: [id number] Date of Birth: [birth year]  Today's Date: 06/20/2011 Time: [id number] Time Calculation (min): 50 min Visit#: 12  of 24   Re-eval: 07/20/11    Past Medical History: No past medical history on file. Past Surgical History: No past surgical history on file.  Subjective-   I'm doing my exercises at home; what I can.   RLE AROM (degrees) Right Hip Extension 0-30:  (8 degrees away from neutral; PROM to neutral) was 8 degrees away from neutral passively. Right Hip Flexion 0-125: 15  (Passively 42 degrees) was passively 15. Right Hip ABduction 0-45: 10  (passively 10 degrees) was 5 degrees passively. Right Hip ADduction 0-25: 5  Was 0 RLE Strength Right Hip Flexion: 1/5   Was 1/5 Right Hip Extension: 2-/5 was 1/5 Right Hip ABduction: 2-/5 was  1/5 Right Hip ADduction: 2-/5 (able to complete full range against gravity no wt. But does not have residual limb.) was 1/5 LLE AROM (degrees) Left Hip Extension 0-30:  (Passively 10 degrees from neutral. Active 20) Was passively 20 degrees away from neutral. Left Hip Flexion 0-125: 25  (passively to 60 degrees) was passively 60 degrees Left Hip ABduction 0-45: 5  (PROM 15) was passively 10 degrees Left Hip ADduction 0-25: 0 LLE Strength Left Hip Flexion: 2-/5 was 1/5 Left Hip Extension:  (1+) was 1/5 Left Hip ABduction: 2/5 (Pt is able to lift 4# but does not have residual limb) was 2-/5 Left Hip ADduction: 2-/5 was 1/5, Exercise/Treatments Stretches Passive Hamstring Stretch Limitations: PROM to all joints x 10 sec 5x each.    Sidelying Hip ABduction: 10 reps Hip ABduction Limitations: L with 5# wtl R 0 Prone  Hip Extension: 15 reps;Both Contract/Relax to Increase Flexion: PROM for hip extension  Physical Therapy Assessment and Plan PT Assessment and Plan Clinical Impression Statement: Pt has shown gains in ROM and strength will continue to see  pt Rehab Potential: Good Clinical Impairments Affecting Rehab Potential: contractures and weakness. PT Frequency: Min 3X/week PT Duration: 8 weeks PT Treatment/Interventions: DME instruction;Gait training;Therapeutic exercise;Other (comment) (Prosthetic training) PT Plan: continue to work on ROM and strength    Goals Home Exercise Program Pt will Perform Home Exercise Program: Independently PT Goal: Perform Home Exercise Program - Progress: Met PT Short Term Goals PT Short Term Goal 1 - Progress: Progressing toward goal PT Short Term Goal 2: L hip flexion has not made this goal;  all others are progressing PT Short Term Goal 2 - Progress: Met PT Long Term Goals PT Long Term Goal 1 - Progress: Progressing toward goal PT Long Term Goal 2 - Progress: Progressing toward goal Long Term Goal 3 Progress: Not met Long Term Goal 4 Progress: Not met  Problem List Patient Active Problem List  Diagnoses  . Stiffness of joint, not elsewhere classified, multiple sites  . Weakness of both legs  . Contracture of hip joint  . Other general symptoms    PT - End of Session Activity Tolerance: Patient tolerated treatment well General Cognition: [facility] for tasks performed   RUSSELL,[name] 06/20/2011, 4:35 PM  Physician Documentation Your signature is required to indicate approval of the treatment plan as stated above.  Please sign and either send electronically or make a copy of this report for your files and return this physician signed original.   Please mark one 1.__approve of plan  2. ___approve of plan with the following conditions.   ______________________________  _____________________ Physician Signature                                                                                                             Date

## 2011-06-22 ENCOUNTER — Ambulatory Visit (HOSPITAL_COMMUNITY): Payer: Medicare Other | Admitting: Specialist

## 2011-06-22 ENCOUNTER — Telehealth (HOSPITAL_COMMUNITY): Payer: Self-pay

## 2011-06-22 ENCOUNTER — Ambulatory Visit (HOSPITAL_COMMUNITY): Payer: Medicare Other | Admitting: Physical Therapy

## 2011-06-24 ENCOUNTER — Ambulatory Visit (HOSPITAL_COMMUNITY)
Admission: RE | Admit: 2011-06-24 | Discharge: 2011-06-24 | Disposition: A | Discharge status: Home / Self Care | Payer: Medicare Other | Source: Ambulatory Visit | Attending: Family Medicine | Admitting: Family Medicine

## 2011-06-24 ENCOUNTER — Ambulatory Visit (HOSPITAL_COMMUNITY): Payer: Medicare Other | Admitting: Physical Therapy

## 2011-06-24 NOTE — Progress Notes (Signed)
Physical Therapy Treatment Patient Details  Name: Robert Campbell MRN: [id number] Date of Birth: [birth year]  Today's Date: 06/24/2011 Time: [id number] Time Calculation (min): 60 min Visit#: 13  of 24   Re-eval: 07/20/11 Charges: Therex x 55'  Subjective: Symptoms/Limitations Symptoms: Pt reports HEP compliance. Pain Assessment Currently in Pain?: No/denies  Exercise/Treatments Seated Other Seated Knee Exercises: Pt comes to sitting with hands than puts hands in lap therapist has back to back have patient try and pull his back off therapists. Supine Straight Leg Raises: Both;AAROM;10 reps Other Supine Knee Exercises: PROM to B hips abd and flexion Other Supine Knee Exercises: 20 straight ab crunch Sidelying Hip ABduction: 10 reps Hip ABduction Limitations: L with 5# wt Hip ADduction: 10 reps;Limitations Other Sidelying Knee Exercises: PROM to B for ext Prone  Hip Extension: 15 reps;Both Contract/Relax to Increase Flexion: PROM for hip extension Other Prone Exercises: POE x 3'   Physical Therapy Assessment and Plan PT Assessment and Plan Clinical Impression Statement: Pt continues to display gains in strength and ROM. Pt presesnt with increased hip stability.Pt displays imrpoved hip flexor strength with SLR. PT Plan: Continue to progress strength/ROM.     Problem List Patient Active Problem List  Diagnoses  . Stiffness of joint, not elsewhere classified, multiple sites  . Weakness of both legs  . Contracture of hip joint  . Other general symptoms    PT - End of Session Activity Tolerance: Patient tolerated treatment well General Behavior During Session: [facility] for tasks performed Cognition: [facility] for tasks performed  [name] 06/24/2011, 11:15 AM

## 2011-06-27 ENCOUNTER — Ambulatory Visit (HOSPITAL_COMMUNITY): Payer: Medicare Other | Admitting: Specialist

## 2011-06-27 ENCOUNTER — Ambulatory Visit (HOSPITAL_COMMUNITY)
Admission: RE | Admit: 2011-06-27 | Discharge: 2011-06-27 | Disposition: A | Discharge status: Home / Self Care | Payer: Medicare Other | Source: Ambulatory Visit | Attending: Family Medicine | Admitting: Family Medicine

## 2011-06-27 DIAGNOSIS — R6889 Other general symptoms and signs: Secondary | ICD-10-CM

## 2011-06-27 DIAGNOSIS — M2560 Stiffness of unspecified joint, not elsewhere classified: Secondary | ICD-10-CM

## 2011-06-27 DIAGNOSIS — R29898 Other symptoms and signs involving the musculoskeletal system: Secondary | ICD-10-CM

## 2011-06-27 DIAGNOSIS — M24559 Contracture, unspecified hip: Secondary | ICD-10-CM

## 2011-06-27 NOTE — Progress Notes (Signed)
Physical Therapy Treatment Patient Details  Name: Robert Campbell MRN: [id number] Date of Birth: 04/02/1971  Today's Date: 06/27/2011 Time: 1055-1205 Time Calculation (min): 70 min Visit#: 14  of 24   Re-eval: 07/20/11    Subjective:    Precautions/Restrictions     Mobility (including Balance)       Exercise/Treatments   Seated Other Seated Knee Exercises: Pt comes to sitting with hands than puts hands in lap therapist has back to back have patient try and pull his back off therapists. Supine Straight Leg Raises: Both;AAROM;10 reps (AAROM x 15) Other Supine Knee Exercises: PROM to B hips abd and flexion Other Supine Knee Exercises: 15 ab crunch; 10oblique  Sidelying Hip ABduction: 15 reps Hip ABduction Limitations: L with 5# wt Hip ADduction: 10 reps;Limitations Other Sidelying Knee Exercises: PROM to B for ext Prone  Hip Extension: 15 reps;Both Contract/Relax to Increase Flexion: PROM for hip extension      Physical Therapy Assessment and Plan PT Assessment and Plan Clinical Impression Statement: Pt continues to make gains.  Will begin trying to stand with prosthetic at the end of this month Rehab Potential: Good PT Plan: Continue to progress ROM and strength    Goals    Problem List Patient Active Problem List  Diagnoses  . Stiffness of joint, not elsewhere classified, multiple sites  . Weakness of both legs  . Contracture of hip joint  . Other general symptoms    PT - End of Session Activity Tolerance: Patient tolerated treatment well General Behavior During Session: [facility] for tasks performed Cognition: [facility] for tasks performed  RUSSELL,[name] 06/27/2011, 12:32 PM

## 2011-06-27 NOTE — Patient Instructions (Signed)
Pt ply board under bed to make it firmer and better to do exercises in.

## 2011-06-29 ENCOUNTER — Ambulatory Visit (HOSPITAL_COMMUNITY): Payer: Medicare Other | Admitting: Specialist

## 2011-06-29 ENCOUNTER — Ambulatory Visit (HOSPITAL_COMMUNITY)
Admission: RE | Admit: 2011-06-29 | Discharge: 2011-06-29 | Disposition: A | Discharge status: Home / Self Care | Payer: Medicare Other | Source: Ambulatory Visit | Attending: Family Medicine | Admitting: Family Medicine

## 2011-06-29 NOTE — Progress Notes (Signed)
Physical Therapy Treatment Patient Details  Name: Robert Campbell MRN: [id number] Date of Birth: 1970/09/18  Today's Date: 06/29/2011 Time: 1110-1220 Time Calculation (min): 70 min Visit#: 15  of 24   Re-eval: 07/30/11 Charges: Therex x 60'   Subjective: Symptoms/Limitations Symptoms: Pt reports not pain, just the usual mm soreness. Pain Assessment Currently in Pain?: No/denies   Exercise/Treatments Supine Straight Leg Raises: Both;AAROM;10 reps Straight Leg Raise with External Rotation: 10 reps Other Supine Knee Exercises: PROM to B hips flexion Other Supine Knee Exercises: 20 ab crunch Sidelying Hip ABduction: 15 reps Hip ABduction Limitations: L with 5# wt Hip ADduction: 10 reps;Limitations Other Sidelying Knee Exercises: PROM to B for ext Prone  Hip Extension: 15 reps;Both Contract/Relax to Increase Flexion: PROM for hip extension Other Prone Exercises: POE x 3'    Physical Therapy Assessment and Plan PT Assessment and Plan Clinical Impression Statement: Pt continues to display gains in strength and ROM. Pt requires decreased assistance with SLR with session. Pt requires multimodal cueing to avoid trunk movement to complensate for  LE weakness. PT Plan: Continue to progress per PT POC.     Problem List Patient Active Problem List  Diagnoses  . Stiffness of joint, not elsewhere classified, multiple sites  . Weakness of both legs  . Contracture of hip joint  . Other general symptoms    PT - End of Session Activity Tolerance: Patient tolerated treatment well General Behavior During Session: [facility] for tasks performed Cognition: [facility] for tasks performed  [name] 06/29/2011, 12:37 PM

## 2011-07-01 ENCOUNTER — Ambulatory Visit (HOSPITAL_COMMUNITY)
Admission: RE | Admit: 2011-07-01 | Discharge: 2011-07-01 | Disposition: A | Discharge status: Home / Self Care | Payer: Medicare Other | Source: Ambulatory Visit | Attending: Family Medicine | Admitting: Family Medicine

## 2011-07-01 ENCOUNTER — Ambulatory Visit (HOSPITAL_COMMUNITY): Payer: Medicare Other | Admitting: Physical Therapy

## 2011-07-01 NOTE — Progress Notes (Signed)
Physical Therapy Treatment Patient Details  Name: Robert Campbell MRN: [id number] Date of Birth: 15-Aug-1970  Today's Date: 07/01/2011 Time: [id number] Time Calculation (min): 59 min Visit#: 16  of 24   Re-eval: 07/29/11    Subjective: Symptoms/Limitations Symptoms: Doing well  Precautions/Restrictions     Mobility (including Balance)       Exercise/Treatments Stretches  PROM to all joints Seated Other Seated Knee Exercises:  (Pt back to back with therapist.  Pt tries to lift off therap) Supine Straight Leg Raises: Both;AAROM;10 reps Straight Leg Raise with External Rotation: 10 reps Other Supine Knee Exercises: PROM to B hips flexion Other Supine Knee Exercises: 20 ab crunch Sidelying Hip ABduction: 15 reps Hip ABduction Limitations: L with 5# wt Hip ADduction: 15 reps;Limitations  PROM to B for ext PT Active flex/extension x 15 Prone  Hip Extension: 15 reps;Both Contract/Relax to Increase Flexion: PROM for hip extension Other Prone Exercises: POE x 3'   Physical Therapy Assessment and Plan PT Assessment and Plan Clinical Impression Statement: Pt continues to improve but gains are not a quick now.  Pt needs verbal and tactile cuing to get patient to work the correct mm. PT Plan: contingut to progress patient .    Goals   Problem List Patient Active Problem List  Diagnoses  . Stiffness of joint, not elsewhere classified, multiple sites  . Weakness of both legs  . Contracture of hip joint  . Other general symptoms     RUSSELL,[name] 07/01/2011, 12:14 PM

## 2011-07-04 ENCOUNTER — Ambulatory Visit (HOSPITAL_COMMUNITY): Payer: Medicare Other | Admitting: Specialist

## 2011-07-04 ENCOUNTER — Ambulatory Visit (HOSPITAL_COMMUNITY)
Admission: RE | Admit: 2011-07-04 | Discharge: 2011-07-04 | Disposition: A | Discharge status: Home / Self Care | Payer: Medicare Other | Source: Ambulatory Visit | Attending: Family Medicine | Admitting: Family Medicine

## 2011-07-04 NOTE — Progress Notes (Signed)
Physical Therapy Treatment Patient Details  Name: Robert Campbell MRN: [id number] Date of Birth: 22-Jan-1971  Today's Date: 07/04/2011 Time: 1100-1208 Time Calculation (min): 68 min Visit#: 18  of 24   Re-eval: 07/29/11 Charges: Therex x 63'  Subjective: Symptoms/Limitations Symptoms: Pt reports no pain. Pain Assessment Currently in Pain?: No/denies   Exercise/Treatments Supine Straight Leg Raises: Both;AAROM;10 reps Straight Leg Raise with External Rotation: 10 reps Other Supine Knee Exercises: PROM to B hips flexion and adduction Other Supine Knee Exercises:  (Time) Sidelying Hip ABduction: 15 reps Hip ABduction Limitations: L with 5# wt Hip ADduction: 15 reps;Limitations Other Sidelying Knee Exercises: PROM to B for ext Prone  Hip Extension: 15 reps;Both Contract/Relax to Increase Flexion: PROM for hip extension Other Prone Exercises: POE x 3'  Physical Therapy Assessment and Plan PT Assessment and Plan Clinical Impression Statement: Pt repsponds well to mm tapping to facilitate contraction. Pt requires VC's to avoid upper body compensation for LE weakness. Pt displays increased hip stability with sidelying exercises. PT Plan: Continue to progress strength/ROM per PT POC.     Problem List Patient Active Problem List  Diagnoses  . Stiffness of joint, not elsewhere classified, multiple sites  . Weakness of both legs  . Contracture of hip joint  . Other general symptoms    PT - End of Session Activity Tolerance: Patient tolerated treatment well General Behavior During Session: [facility] for tasks performed Cognition: [facility] for tasks performed  [name] 07/04/2011, 12:18 PM

## 2011-07-06 ENCOUNTER — Ambulatory Visit (HOSPITAL_COMMUNITY): Payer: Medicare Other | Admitting: Physical Therapy

## 2011-07-06 ENCOUNTER — Ambulatory Visit (HOSPITAL_COMMUNITY)
Admission: RE | Admit: 2011-07-06 | Discharge: 2011-07-06 | Disposition: A | Discharge status: Home / Self Care | Payer: Medicare Other | Source: Ambulatory Visit | Attending: Family Medicine | Admitting: Family Medicine

## 2011-07-06 NOTE — Progress Notes (Signed)
Physical Therapy Treatment Patient Details  Name: Robert Campbell MRN: [id number] Date of Birth: Mar 10, 1971  Today's Date: 07/06/2011 Time: 1103-1212 Time Calculation (min): 69 min Visit#: 19  of 24   Re-eval: 07/29/11 Charges: Therex x 65'  Subjective: Symptoms/Limitations Symptoms: Pt reports that he is doing his exercses at home. Pain Assessment Currently in Pain?: No/denies   Exercise/Treatments Supine Hip Adduction Isometric: Limitations Hip Adduction Isometric Limitations: Contract relax to B adductor Straight Leg Raises: Both;AAROM;10 reps Straight Leg Raise with External Rotation: 10 reps Other Supine Knee Exercises: PROM to B hips flexion Other Supine Knee Exercises: 20 ab crunch Sidelying Hip ABduction: 15 reps Hip ABduction Limitations: L with 5# wt Hip ADduction: 15 reps;Limitations Prone  Hip Extension: 15 reps;Both Contract/Relax to Increase Flexion: PROM for hip extension Other Prone Exercises: POE x 3'  Physical Therapy Assessment and Plan PT Assessment and Plan Clinical Impression Statement: Pt displays increased gluteal strength this session. Pt continues to respond well to manual tapping and VC's to isolate correct mm for exercises. Began contract relax to adductors to increase flexibility/strength. PT Plan: Continue to progress per PT POC.     Problem List Patient Active Problem List  Diagnoses  . Stiffness of joint, not elsewhere classified, multiple sites  . Weakness of both legs  . Contracture of hip joint  . Other general symptoms    PT - End of Session Activity Tolerance: Patient tolerated treatment well General Behavior During Session: [facility] for tasks performed Cognition: [facility] for tasks performed  [name] 07/06/2011, 12:21 PM

## 2011-07-08 ENCOUNTER — Ambulatory Visit (HOSPITAL_COMMUNITY): Payer: Medicare Other | Admitting: Specialist

## 2011-07-11 ENCOUNTER — Ambulatory Visit (HOSPITAL_COMMUNITY): Payer: Medicare Other | Admitting: Physical Therapy

## 2011-07-13 ENCOUNTER — Ambulatory Visit (HOSPITAL_COMMUNITY): Payer: Medicare Other | Admitting: Physical Therapy

## 2011-07-15 ENCOUNTER — Ambulatory Visit (HOSPITAL_COMMUNITY)
Admission: RE | Admit: 2011-07-15 | Discharge: 2011-07-15 | Disposition: A | Discharge status: Home / Self Care | Payer: Medicare Other | Source: Ambulatory Visit | Attending: Family Medicine | Admitting: Family Medicine

## 2011-07-15 ENCOUNTER — Ambulatory Visit (HOSPITAL_COMMUNITY): Payer: Medicare Other | Admitting: Physical Therapy

## 2011-07-15 DIAGNOSIS — R6889 Other general symptoms and signs: Secondary | ICD-10-CM

## 2011-07-15 DIAGNOSIS — M24559 Contracture, unspecified hip: Secondary | ICD-10-CM

## 2011-07-15 DIAGNOSIS — R29898 Other symptoms and signs involving the musculoskeletal system: Secondary | ICD-10-CM

## 2011-07-15 DIAGNOSIS — M2560 Stiffness of unspecified joint, not elsewhere classified: Secondary | ICD-10-CM

## 2011-07-15 NOTE — Progress Notes (Signed)
Physical Therapy Treatment Patient Details  Name: Robert Campbell MRN: [id number] Date of Birth: 01/08/1971  Today's Date: 07/15/2011 Time: [id number] Time Calculation (min): 71 min Visit#: 20  of 24   Re-eval: 07/29/11  Charge: therex 65 min  Subjective: Symptoms/Limitations Symptoms: Pain free today, doing exercises at home.  Pain Assessment Currently in Pain?: No/denies  Objective:   Exercise/Treatments Stretches Hip Flexor Stretch: 2 reps;30 seconds;Limitations Hip Flexor Stretch Limitations: B LE supine Supine Hip Adduction Isometric: Limitations Hip Adduction Isometric Limitations: stretch to B adductor Straight Leg Raises: AAROM;Both;15 reps;Limitations Other Supine Knee Exercises: PROM to B hips flexion Other Supine Knee Exercises: 2sets 20 reps ab crunch Sidelying Hip ABduction: 15 reps Hip ADduction: 15 reps;Limitations Other Sidelying Knee Exercises: PROM to B for ext/abd 3x 30" Prone  Hip Extension: 15 reps;AAROM Contract/Relax to Increase Flexion: PROM for hip extension 4x30" Other Prone Exercises: POE x 3'      Physical Therapy Assessment and Plan PT Assessment and Plan Clinical Impression Statement: Pt tolerated well to total session.  Pt required AA with most activities with cueing for proper form/positioning for proper musculature.  Pt with increased flexibilty following manual stretches. PT Plan: Continue to progress per PT POC    Goals Home Exercise Program Pt will Perform Home Exercise Program: Independently PT Short Term Goals Time to Complete Short Term Goals: 2 weeks PT Short Term Goal 1: increase ROM by 10 degrees PT Short Term Goal 2: increase strength by 1/2 grade. PT Long Term Goals Time to Complete Long Term Goals: 8 weeks PT Long Term Goal 1: Increase mm strength by one grade. PT Long Term Goal 2: increase ROM by 20- 30 degrees. Long Term Goal 3: able to come up onto prosthetic with min assist Long Term Goal 4: able to walk 30 ft  with walker and min assist.  Problem List Patient Active Problem List  Diagnoses  . Stiffness of joint, not elsewhere classified, multiple sites  . Weakness of both legs  . Contracture of hip joint  . Other general symptoms    PT - End of Session Activity Tolerance: Patient tolerated treatment well General Behavior During Session: [facility] for tasks performed Cognition: [facility] for tasks performed  Aldona Lento 07/15/2011, 11:54 AM

## 2011-07-20 ENCOUNTER — Ambulatory Visit (HOSPITAL_COMMUNITY)
Admission: RE | Admit: 2011-07-20 | Discharge: 2011-07-20 | Disposition: A | Discharge status: Home / Self Care | Payer: Medicare Other | Source: Ambulatory Visit | Attending: Family Medicine | Admitting: Family Medicine

## 2011-07-20 DIAGNOSIS — M6281 Muscle weakness (generalized): Secondary | ICD-10-CM | POA: Insufficient documentation

## 2011-07-20 DIAGNOSIS — R269 Unspecified abnormalities of gait and mobility: Secondary | ICD-10-CM | POA: Insufficient documentation

## 2011-07-20 DIAGNOSIS — Z5189 Encounter for other specified aftercare: Secondary | ICD-10-CM | POA: Insufficient documentation

## 2011-07-20 DIAGNOSIS — S78119A Complete traumatic amputation at level between unspecified hip and knee, initial encounter: Secondary | ICD-10-CM | POA: Insufficient documentation

## 2011-07-20 NOTE — Progress Notes (Signed)
Physical Therapy Treatment Patient Details  Name: Robert Campbell MRN: [id number] Date of Birth: 07-09-1971  Today's Date: 07/20/2011 Time: [id number] Time Calculation (min): 51 min Visit#: 21  of 24   Re-eval: 07/22/11 Charges: Therex x 48'  Subjective: Symptoms/Limitations Symptoms: No pain. Pt reports HEP compliance. Pain Assessment Currently in Pain?: No/denies  Exercise/Treatments  Supine Straight Leg Raises: AAROM;Both;15 reps;Limitations Other Supine Knee Exercises: PROM to B hips flexion Sidelying Hip ABduction: 15 reps Hip ABduction Limitations: L with 5# wt Hip ADduction: 15 reps;Limitations Prone  Hip Extension: 15 reps;AAROM Contract/Relax to Increase Flexion: PROM for hip extension 4x30" Other Prone Exercises: POE x 3'  Physical Therapy Assessment and Plan PT Assessment and Plan Clinical Impression Statement: Pt continues to require TC's to  to avoid L trunk rotation with exercises. Pt displays increased strength with B hip abd. Pt reports no increase in pain at end of session. PT Plan: Reassess next session.     Problem List Patient Active Problem List  Diagnoses  . Stiffness of joint, not elsewhere classified, multiple sites  . Weakness of both legs  . Contracture of hip joint  . Other general symptoms    PT - End of Session Activity Tolerance: Patient tolerated treatment well General Behavior During Session: [facility] for tasks performed Cognition: [facility] for tasks performed  [name] 07/20/2011, 11:16 AM

## 2011-07-22 ENCOUNTER — Ambulatory Visit (HOSPITAL_COMMUNITY)
Admission: RE | Admit: 2011-07-22 | Discharge: 2011-07-22 | Disposition: A | Discharge status: Home / Self Care | Payer: Medicare Other | Source: Ambulatory Visit | Attending: Family Medicine | Admitting: Family Medicine

## 2011-07-22 DIAGNOSIS — R29898 Other symptoms and signs involving the musculoskeletal system: Secondary | ICD-10-CM

## 2011-07-22 DIAGNOSIS — M2560 Stiffness of unspecified joint, not elsewhere classified: Secondary | ICD-10-CM

## 2011-07-22 DIAGNOSIS — R6889 Other general symptoms and signs: Secondary | ICD-10-CM

## 2011-07-22 NOTE — Progress Notes (Signed)
Physical Therapy  RE-Evaluation  Patient Details  Name: Robert Campbell MRN: [id number] Date of Birth: 10-Jul-1971  Today's Date: 07/22/2011 Time: 1015-1106 Time Calculation (min): 51 min  Visit#: 22  of 34   Re-eval: 08/21/11    Past Medical History: No past medical history on file. Past Surgical History: No past surgical history on file.  Subjective Symptoms/Limitations Symptoms: Pt states that he is exercising everyday       Assessment (  )= last reassessment 06/20/11 RLE AROM (degrees) Right Hip Extension 0-30:  AROM 5 degree from neutral PROM 5 degrees  (-8/PROM 0) Right Hip Flexion 0-125: AROM 20;  PROM 45   (AROM 20/PROM 45) Right Hip ABduction 0-45:  AROM 1 PROM 10  (AROM 1/ PROM 10) Right Hip ADduction 0-25: 5  (1) RLE Strength Right Hip Flexion: 2-/5 (1/5) Right Hip Extension: 2/5 (2-/5)_ Right Hip ABduction: 2-/5  (2-/5) Right Hip ADduction: 2-/5 (1/5) LLE AROM (degrees) Left Hip Extension 0-30:  AROM 20 degree away from neutral; PROM 2 degree from neutral; (AROM -20; PROM -10) Left Hip Flexion 0-125: AROM 25  Passively 65;  (AROM 25; PROM 60) Left Hip ABduction 0-45:  AROM 5 PROM 8;  ( AROM 5 PROM 15) Left Hip ADduction 0-25: 3  (0) LLE Strength Left Hip Flexion: 2/5 (2-/5) Left Hip Extension: 2/5  (1+/5) Left Hip ABduction: 2+/5  (2/5) Left Hip ADduction: 3-/5  (2-/5)  Exercise/Treatments Mobility/Balance  We still have not started prosthetic training.  We have a call into the prosthetist to attempt a meeting for education.    Stretches Hip Flexor Stretch: 3 reps;30 seconds       Hip ABduction: 5 reps Hip ADduction: 5 reps Other Sidelying Knee Exercises: PROM for hips b all directions  exercise limited by re-evaluation.   Physical Therapy Assessment and Plan PT Assessment and Plan Clinical Impression Statement: Pt able to localize mm better without substitution.  ROM and strength continues to improve .  Unable to put prothesis on due to needing to  speak with prosthestist about how to put them on. PT Frequency: Min 3X/week PT Duration: 4 weeks PT Treatment/Interventions: Therapeutic activities;Therapeutic exercise;Functional mobility training PT Plan: Continue with treatment attempt to get prothestist in to do inservice on prosthesis.    Goals Home Exercise Program PT Goal: Perform Home Exercise Program - Progress: Met PT Short Term Goals PT Short Term Goal 1 - Progress: Met PT Short Term Goal 2 - Progress: Met PT Long Term Goals PT Long Term Goal 1 - Progress: Met PT Long Term Goal 2 - Progress: Progressing toward goal Long Term Goal 3: Continue with this goal time 4 weeks. Goal = able to don prothesis with min assist. Long Term Goal 3 Progress: Not met  Long Term Goal 4: continue with this goal time 8 weeks Goal = able to ambulate with walker and min assist x 30 ft. Long Term Goal 4 Progress: Not met PT Long Term Goal 5: New goal as of 07/22/11- increase mm strength by 1/2 grade from 07/22/11 mm strength 4 weeks  Problem List Patient Active Problem List  Diagnoses  . Stiffness of joint, not elsewhere classified, multiple sites  . Weakness of both legs  . Contracture of hip joint  . Other general symptoms    PT - End of Session Activity Tolerance: Patient tolerated treatment well General Behavior During Session: [facility] for tasks performed Cognition: [facility] for tasks performed PT Plan of Care Consulted and Agree with Plan  of Care: Patient   RUSSELL,[name] 07/22/2011, 12:21 PM  Physician Documentation Your signature is required to indicate approval of the treatment plan as stated above.  Please sign and either send electronically or make a copy of this report for your files and return this physician signed original.   Please mark one 1.__approve of plan  2. ___approve of plan with the following conditions.   ______________________________                                                          _____________________ Physician  Signature                                                                                                             Date

## 2011-07-25 ENCOUNTER — Ambulatory Visit (HOSPITAL_COMMUNITY)
Admission: RE | Admit: 2011-07-25 | Discharge: 2011-07-25 | Disposition: A | Discharge status: Home / Self Care | Payer: Medicare Other | Source: Ambulatory Visit | Attending: Family Medicine | Admitting: Family Medicine

## 2011-07-25 NOTE — Progress Notes (Signed)
Physical Therapy Treatment Patient Details  Name: Robert Campbell MRN: [id number] Date of Birth: 1970/07/29  Today's Date: 07/25/2011 Time: [id number] Time Calculation (min): 42 min Visit#: 33  of 34   Re-eval: 08/21/11 Charges: Therex x 38'  Subjective: Symptoms/Limitations Symptoms: I'm feeling good today. Pain Assessment Currently in Pain?: No/denies   Exercise/Treatments Seated Other Seated Knee Exercises: Seated against wall attempting x reach forward x 5 Supine Other Supine Knee Exercises: PROM to B hips flexion Sidelying Hip ABduction: 15 reps Hip ABduction Limitations: L with 5# wt Hip ADduction: 15 reps Prone  Hip Extension: 15 reps;AAROM Contract/Relax to Increase Flexion: PROM for hip extension 2x1' Other Prone Exercises: POE x 3'      Physical Therapy Assessment and Plan PT Assessment and Plan Clinical Impression Statement: Pt continues to display good gains in strength. ROM is progressing as well. Pt without increased pain at end of session. Attempted seated exercises with back against wall reaching forward. Exercises ineffective secondary to decreased ROM.  PT Plan: Continue to progress per PT POC.     Problem List Patient Active Problem List  Diagnoses  . Stiffness of joint, not elsewhere classified, multiple sites  . Weakness of both legs  . Contracture of hip joint  . Other general symptoms    PT - End of Session Activity Tolerance: Patient tolerated treatment well General Behavior During Session: [facility] for tasks performed Cognition: [facility] for tasks performed  [name] 07/25/2011, 10:38 AM

## 2011-07-27 ENCOUNTER — Ambulatory Visit (HOSPITAL_COMMUNITY): Payer: Medicare Other | Admitting: Physical Therapy

## 2011-07-29 ENCOUNTER — Ambulatory Visit (HOSPITAL_COMMUNITY)
Admission: RE | Admit: 2011-07-29 | Discharge: 2011-07-29 | Disposition: A | Discharge status: Home / Self Care | Payer: Medicare Other | Source: Ambulatory Visit | Attending: Family Medicine | Admitting: Family Medicine

## 2011-07-29 NOTE — Progress Notes (Signed)
Physical Therapy Treatment Patient Details  Name: Brenham Rayner MRN: [id number] Date of Birth: September 18, 1970  Today's Date: 07/29/2011 Time: 0927-1019 Time Calculation (min): 52 min Visit#: 24  of 34   Re-eval: 09/16/11  the ex x 52  Subjective: Symptoms/Limitations Symptoms: I can tell when I miss therapy   Exercise/Treatments     Stretches Hip Flexor Stretch: 3 reps;30 seconds   Supine Hip Adduction Isometric Limitations: ab/adduction x 10 Straight Leg Raises:  (AA x 10 REPS) Other Supine Knee Exercises: PROM to all x 5 Sidelying Hip ABduction: 5 reps;10 reps Hip ABduction Limitations: 5# Hip ADduction: 5 reps;10 reps Other Sidelying Knee Exercises: PROM for hips b all directions Prone  Hip Extension Limitations: Upper body on blue foam x 4' Other Prone Exercises: x15    Physical Therapy Assessment and Plan PT Assessment and Plan Clinical Impression Statement: Pt continues to make gains.   PT Plan: Attempt up on prothesis next visit.    Goals    Problem List Patient Active Problem List  Diagnoses  . Stiffness of joint, not elsewhere classified, multiple sites  . Weakness of both legs  . Contracture of hip joint  . Other general symptoms    PT - End of Session Activity Tolerance: Patient tolerated treatment well General Behavior During Session: [facility] for tasks performed Cognition: [facility] for tasks performed  RUSSELL,[name] 07/29/2011, 11:25 AM

## 2011-07-29 NOTE — Patient Instructions (Signed)
Stretch hip flexors.

## 2011-08-01 ENCOUNTER — Ambulatory Visit (HOSPITAL_COMMUNITY)
Admission: RE | Admit: 2011-08-01 | Discharge: 2011-08-01 | Disposition: A | Discharge status: Home / Self Care | Payer: Medicare Other | Source: Ambulatory Visit | Attending: Family Medicine | Admitting: Family Medicine

## 2011-08-01 NOTE — Progress Notes (Signed)
Physical Therapy Treatment Patient Details  Name: Robert Campbell MRN: [id number] Date of Birth: [birth year]  Today's Date: 08/01/2011 Time: 1000-1140 Time Calculation (min): 100 min Visit#: 25  of 34   Re-eval: 08/21/11 Charges: There act x 20' DME x 20' Pt education x30'  Subjective: Symptoms/Limitations Symptoms: Pt is excited about getting on prostheses. Pain Assessment Currently in Pain?: No/denies   Exercise/Treatments Prostheses donned by therapist x 1 Prostheses doffed at end of session with min assist from therapist x 1 Pt had feet of prostheses against wall and pushed WC forward to push residual limbs into protheses x 5' Sit to stand x 2: -1st attempt: Pt required max assist to come from sit to stand. 4 people assisted for safety. Pt stood for 1' then took 1' to return to seat with max assist. -2nd attempt: Pt required mod assist to come from sit to stand. 4 people assisted for safety. Pt stood for 2' then took 1' to return to seat with mod assist. Pt hold wt primary through B UE but is CGA while standing. Pt able to take small steps back.  Education: Pt educated on wearing liner for a 2 hours twice a day then building up from that as skin integrity is WNL to decrease size of leg/swelling.. Pt also educated on donning prostheses when he is able to take them home and spending 15' at a time with feet of prostheses against wall of hard surface to decrease leg size/swelling.Pt also educated to work of sitting unsupported in hospital bed throughout the day.   Physical Therapy Assessment and Plan PT Assessment and Plan Clinical Impression Statement: Raynelle Highland, PT present for session today. Robin specializes in amputee care. Shirlean Mylar began pt education at 10:00. Pt educated on multiple aspects of self care and home exercises. Prostheses donned by therapist. Prostheses somewhat too small for pt and leg size has changed since pt last donned prostheses. Pt able to come to standing  with most of his wt through his arms and 4 people for safety. Pt required less assistance the second time standing then the first. PT Plan: Continue to progress per PT POC. Work on unsupported sitting and and scooting forward next session.     Problem List Patient Active Problem List  Diagnoses  . Stiffness of joint, not elsewhere classified, multiple sites  . Weakness of both legs  . Contracture of hip joint  . Other general symptoms    PT - End of Session Activity Tolerance: Patient tolerated treatment well General Behavior During Session: [facility] for tasks performed Cognition: [facility] for tasks performed  [name] 08/01/2011, 12:18 PM

## 2011-08-03 ENCOUNTER — Ambulatory Visit (HOSPITAL_COMMUNITY)
Admission: RE | Admit: 2011-08-03 | Discharge: 2011-08-03 | Disposition: A | Discharge status: Home / Self Care | Payer: Medicare Other | Source: Ambulatory Visit | Attending: Family Medicine | Admitting: Family Medicine

## 2011-08-03 DIAGNOSIS — M24559 Contracture, unspecified hip: Secondary | ICD-10-CM

## 2011-08-03 DIAGNOSIS — R6889 Other general symptoms and signs: Secondary | ICD-10-CM

## 2011-08-03 DIAGNOSIS — M2560 Stiffness of unspecified joint, not elsewhere classified: Secondary | ICD-10-CM

## 2011-08-03 DIAGNOSIS — R29898 Other symptoms and signs involving the musculoskeletal system: Secondary | ICD-10-CM

## 2011-08-03 NOTE — Progress Notes (Signed)
Physical Therapy Treatment Patient Details  Name: Robert Campbell MRN: [id number] Date of Birth: 1970/10/28  Today's Date: 08/03/2011 Time: 1000-1106 Time Calculation (min): 66 min Visit#: 26  of 34   Re-eval: 08/19/11  Prosthetic training x 12; there ex 40  Subjective: Symptoms/Limitations Symptoms: Pt states that he is not too sore.. Pain Assessment Currently in Pain?: No/denies  Precautions/Restrictions   falls Exercise/Treatments Mobility/Balance  Transfers Transfers: Yes (Pt needs max assist to don prothesis) Sit to Stand: 3: Mod assist (of two) Sit to Stand Details (indicate cue type and reason): Pt had two people on either side and one in front for safety but was able to do this withl mod assist.  Pt able to stand for four minutes. Tried to work on wt Armed forces logistics/support/administrative officer Limitations: PROM to all hip motions Hip Flexor Stretch: 3 reps;30 seconds     Supine Hip Adduction Isometric Limitations: ab/adduction x 10 Straight Leg Raises:  (AA x 10 REPS) Other Supine Knee Exercises: PROM to all x 5 Sidelying Hip ABduction: 5 reps;10 reps Hip ABduction Limitations: 5# Hip ADduction: 5 reps;10 reps Other Sidelying Knee Exercises: S/L hip flex/ext x 10 reps. Prone  Hip Extension: AAROM;Right;Left;10 reps    Physical Therapy Assessment and Plan PT Assessment and Plan Clinical Impression Statement: Tech and Pt donned prothesis prior to visit. Worked on standing activites x 12 min with Pt needing rest breaks.  Pt needs to work on wt shifting and putting less wt on his arms and more on his prothesis. PT Plan: continue to work on Publishing copy.    Goals  Pt to be able to I [facility] prothesis for home use.  Problem List Patient Active Problem List  Diagnoses  . Stiffness of joint, not elsewhere classified, multiple sites  . Weakness of both legs  . Contracture of hip joint  . Other general symptoms    PT - End of Session Equipment  Utilized During Treatment: Gait belt Activity Tolerance: Patient tolerated treatment well General Behavior During Session: [facility] for tasks performed Cognition: [facility] for tasks performed  RUSSELL,[name] 08/03/2011, 11:55 AM

## 2011-08-05 ENCOUNTER — Ambulatory Visit (HOSPITAL_COMMUNITY): Payer: Medicare Other | Admitting: Physical Therapy

## 2011-08-08 ENCOUNTER — Ambulatory Visit (HOSPITAL_COMMUNITY): Payer: Medicare Other | Admitting: Physical Therapy

## 2011-08-10 ENCOUNTER — Ambulatory Visit (HOSPITAL_COMMUNITY)
Admission: RE | Admit: 2011-08-10 | Discharge: 2011-08-10 | Disposition: A | Discharge status: Home / Self Care | Payer: Medicare Other | Source: Ambulatory Visit | Attending: Family Medicine | Admitting: Family Medicine

## 2011-08-10 DIAGNOSIS — R6889 Other general symptoms and signs: Secondary | ICD-10-CM

## 2011-08-10 DIAGNOSIS — R29898 Other symptoms and signs involving the musculoskeletal system: Secondary | ICD-10-CM

## 2011-08-10 DIAGNOSIS — M24559 Contracture, unspecified hip: Secondary | ICD-10-CM

## 2011-08-10 DIAGNOSIS — M2560 Stiffness of unspecified joint, not elsewhere classified: Secondary | ICD-10-CM

## 2011-08-10 NOTE — Progress Notes (Signed)
Physical Therapy Treatment Patient Details  Name: Robert Campbell MRN: [id number] Date of Birth: Mar 18, 1971  Today's Date: 08/10/2011 Time: [id number] Time Calculation (min): 70 min Visit#: 27  of 28   Re-eval: 08/19/11  gt x 18 min; there ex x 40  Subjective: Symptoms/Limitations Symptoms: Pt was unable to come last Friday due to weather and Monday secondary to [employer] day the Lucianne Lei was not running. Pain Assessment Currently in Pain?: No/denies   Exercise/Treatments Mobility/Balance  Transfers Transfers: Yes Sit to Stand: 4: Min assist Stand to Sit: 4: Min assist Ambulation/Gait Ambulation/Gait: Yes Ambulation/Gait Assistance:  (min assist of 2 but 3 people were there for saftey; wt shift side to side and forward backward. Ambulation Distance (Feet): 2 Feet (step forward with R and L;retro hopping  2 ft.)     Stretches Passive Hamstring Stretch Limitations: PROM to all hip motions   Seated Other Seated Knee Exercises: pt/therapist back to back pt attempts to flex trunk to pull forward Supine Straight Leg Raises: AAROM;Both;10 reps Sidelying Hip ABduction: Strengthening;Both;15 reps Hip ADduction: Both;15 reps Other Sidelying Knee Exercises: SL hip flex/ext x 12 Prone  Hip Extension: AAROM;Both;15 reps   Physical Therapy Assessment and Plan PT Assessment and Plan Clinical Impression Statement: Pt is able to tolerate being standing for longer periods of time;  today 4 min 10 seconds.  Worked on [employer] through prothesis more than arms and weight shifting. PT Plan: continue with plan of care.    Goals    Problem List Patient Active Problem List  Diagnoses  . Stiffness of joint, not elsewhere classified, multiple sites  . Weakness of both legs  . Contracture of hip joint  . Other general symptoms    PT - End of Session Activity Tolerance: Patient tolerated treatment well General Behavior During Session: [facility] for tasks performed Cognition: [facility] for tasks  performed  RUSSELL,[name] 08/10/2011, 12:14 PM

## 2011-08-12 ENCOUNTER — Ambulatory Visit (HOSPITAL_COMMUNITY)
Admission: RE | Admit: 2011-08-12 | Discharge: 2011-08-12 | Disposition: A | Discharge status: Home / Self Care | Payer: Medicare Other | Source: Ambulatory Visit | Attending: Family Medicine | Admitting: Family Medicine

## 2011-08-12 DIAGNOSIS — M24559 Contracture, unspecified hip: Secondary | ICD-10-CM

## 2011-08-12 DIAGNOSIS — R29898 Other symptoms and signs involving the musculoskeletal system: Secondary | ICD-10-CM

## 2011-08-12 DIAGNOSIS — R6889 Other general symptoms and signs: Secondary | ICD-10-CM

## 2011-08-12 DIAGNOSIS — M2560 Stiffness of unspecified joint, not elsewhere classified: Secondary | ICD-10-CM

## 2011-08-12 NOTE — Progress Notes (Signed)
Physical Therapy Treatment Patient Details  Name: Robert Campbell MRN: [id number] Date of Birth: [birth year]  Today's Date: 08/12/2011 Time: [id number] Time Calculation (min): 60 min Visit#: 28  of 28   Re-eval: 08/15/11 Assessment Diagnosis:  (B AKA)  Subjective: Symptoms/Limitations Symptoms: Pt states that he is happy he is up on his prothesis now. Pain Assessment Currently in Pain?: No/denies    Exercise/Treatments Mobility/Balance  Transfers Sit to Stand: 3: Mod assist Sit to Stand Details (indicate cue type and reason): mod assist of 2 for the first time after fatigue pt required max assist.  Pt stood four times working on weight shifting. first time 3:30; 2:10; 2:05; 1:30.  Will wait until pt is able to stand five minutes before trying to take steps.     Stretches Passive Hamstring Stretch Limitations: PROM to all hip motions    Seated Other Seated Knee Exercises: pt/therapist back to back pt attempts to flex trunk to pull forward Supine Straight Leg Raises: AAROM;Both;10 reps Sidelying Hip ABduction: Strengthening;Both;15 reps Hip ADduction: Both;15 reps Other Sidelying Knee Exercises: SL hip flex/ext x 12 Prone  Hip Extension: AAROM;Both;15 reps    Physical Therapy Assessment and Plan PT Assessment and Plan Clinical Impression Statement: Pt stood for longer last period but is putting more weight in his LE.    Goals    Problem List Patient Active Problem List  Diagnoses  . Stiffness of joint, not elsewhere classified, multiple sites  . Weakness of both legs  . Contracture of hip joint  . Other general symptoms    PT - End of Session Activity Tolerance: Patient tolerated treatment well General Behavior During Session: [facility] for tasks performed Cognition: [facility] for tasks performed  RUSSELL,[name] 08/12/2011, 11:58 AM

## 2011-08-15 ENCOUNTER — Ambulatory Visit (HOSPITAL_COMMUNITY)
Admission: RE | Admit: 2011-08-15 | Discharge: 2011-08-15 | Disposition: A | Discharge status: Home / Self Care | Payer: Medicare Other | Source: Ambulatory Visit | Attending: Family Medicine | Admitting: Family Medicine

## 2011-08-15 NOTE — Progress Notes (Signed)
Physical Therapy Treatment Patient Details  Name: Robert Campbell MRN: [id number] Date of Birth: 06-11-1971  Today's Date: 08/15/2011 Time: 1010-1110 Tx began my Lenora Boys, PTT (Donning prostheses) Time Calculation (min): 60 min Visit#: 29  of 34   Re-eval: 08/19/11 Charges: Therex x 23' Gait (Pre gait activities) x 10' Prosthetic training x 8'  Subjective: Symptoms/Limitations Symptoms: I can bring my bed all the way up to sitting now. Pain Assessment Currently in Pain?: No/denies   Exercise/Treatments Mobility/Balance  Transfers Sit to Stand: 3: Mod assist Sit to Stand Details (indicate cue type and reason): Mod asssit w/3 assist for safety. Pt transfered from sit to stand 4 times (4:10, 50", 3:15, 1:45) Wt shift R/L.    Prone  Hip Extension: AAROM;Both;15 reps Contract/Relax to Increase Flexion: PROM for hip extension x1'   Physical Therapy Assessment and Plan PT Assessment and Plan Clinical Impression Statement: Pt able to stand for increased amounf of time. Pt displays increased ability to wt shift R to L. Unable to complete all mat exercises secondary to time. PT Plan: Have pt instruct therapist in donning/doffing prostheses.     Problem List Patient Active Problem List  Diagnoses  . Stiffness of joint, not elsewhere classified, multiple sites  . Weakness of both legs  . Contracture of hip joint  . Other general symptoms    PT - End of Session Activity Tolerance: Patient tolerated treatment well General Behavior During Session: [facility] for tasks performed Cognition: [facility] for tasks performed  [name] 08/15/2011, 12:12 PM

## 2011-08-17 ENCOUNTER — Ambulatory Visit (HOSPITAL_COMMUNITY)
Admission: RE | Admit: 2011-08-17 | Discharge: 2011-08-17 | Disposition: A | Discharge status: Home / Self Care | Payer: Medicare Other | Source: Ambulatory Visit | Attending: Family Medicine | Admitting: Family Medicine

## 2011-08-17 NOTE — Progress Notes (Signed)
Physical Therapy Treatment Patient Details  Name: Robert Campbell MRN: [id number] Date of Birth: 21-Jul-1970  Today's Date: 08/17/2011 Time: 1005 (Tx started by Lenora Boys, PTT 10:05-10:16 (Donning prostheses))-1120 Time Calculation (min): 75 min Visit#: 30  of 34   Re-eval: 08/19/11 Charges: Gait x 12' Therex 30' Prosthetic training x 8'  Subjective: Symptoms/Limitations Symptoms: Pt reports that he is wear is liners all day during the day. Pt reports that his skin integrity is good on his LE. Pain Assessment Currently in Pain?: No/denies   Exercise/Treatments Mobility/Balance  Transfers Sit to Stand: 4: Min assist;3: Mod assist Sit to Stand Details (indicate cue type and reason): Completed 4 times. Pt required min assist on first to stands then mod assist on second two secondary to fatigue. Two people assisted. (3:55, 3:5, 3:15,1:20) Wt shifts R/L completed while standing. Stand to Sit: 4: Min assist     Standing Other Standing Knee Exercises: Glute sets while standing x 10 Seated Other Seated Knee Exercises: abd/add AROM while resting after sit-stand 2x10 Supine Other Supine Knee Exercises: PROM to increase hip ext/flex 2 x 1' each Other Supine Knee Exercises: ABD stretch with pillow between thighs x 3'   Physical Therapy Assessment and Plan PT Assessment and Plan Clinical Impression Statement: Pt displays increased control and stability with standing. Size 1 sock applied to residual limbs this session. Mat exercises focused on stretching this session. Unable to complete strengthening secondary to time. Pt advised to continue his strengthening exercises at home. Pt is able to doff prostheses with increased ease this session. PT Plan: Continue to progress per PT POC.      Problem List Patient Active Problem List  Diagnoses  . Stiffness of joint, not elsewhere classified, multiple sites  . Weakness of both legs  . Contracture of hip joint  . Other general symptoms     PT - End of Session Equipment Utilized During Treatment: Gait belt Activity Tolerance: Patient tolerated treatment well General Behavior During Session: [facility] for tasks performed Cognition: [facility] for tasks performed  [name] 08/17/2011, 11:48 AM

## 2011-08-19 ENCOUNTER — Ambulatory Visit (HOSPITAL_COMMUNITY)
Admission: RE | Admit: 2011-08-19 | Discharge: 2011-08-19 | Disposition: A | Discharge status: Home / Self Care | Payer: Medicare Other | Source: Ambulatory Visit | Attending: Family Medicine | Admitting: Family Medicine

## 2011-08-19 DIAGNOSIS — R29898 Other symptoms and signs involving the musculoskeletal system: Secondary | ICD-10-CM

## 2011-08-19 DIAGNOSIS — R6889 Other general symptoms and signs: Secondary | ICD-10-CM

## 2011-08-19 DIAGNOSIS — Z5189 Encounter for other specified aftercare: Secondary | ICD-10-CM | POA: Insufficient documentation

## 2011-08-19 DIAGNOSIS — S78119A Complete traumatic amputation at level between unspecified hip and knee, initial encounter: Secondary | ICD-10-CM | POA: Insufficient documentation

## 2011-08-19 DIAGNOSIS — R269 Unspecified abnormalities of gait and mobility: Secondary | ICD-10-CM | POA: Insufficient documentation

## 2011-08-19 DIAGNOSIS — M6281 Muscle weakness (generalized): Secondary | ICD-10-CM | POA: Insufficient documentation

## 2011-08-19 DIAGNOSIS — M2560 Stiffness of unspecified joint, not elsewhere classified: Secondary | ICD-10-CM

## 2011-08-19 DIAGNOSIS — M24559 Contracture, unspecified hip: Secondary | ICD-10-CM

## 2011-08-19 NOTE — Patient Instructions (Addendum)
Pt given hip abduction pillow told to wear on a consistent basis.

## 2011-08-19 NOTE — Evaluation (Signed)
Physical Therapy Evaluation  Patient Details  Name: Robert Campbell MRN: [id number] Date of Birth: [birth year]  Today's Date: 08/19/2011 Time: [id number] Time Calculation (min): 48 min  Visit#: 31  of 43   Re-eval: 09/18/11    Past Medical History: No past medical history on file. Past Surgical History: No past surgical history on file.  Subjective Symptoms/Limitations Symptoms: No problems How long can you stand comfortably?: Standing at // with two people for safety for four minutes.  Pt was not standing prior to this How long can you walk comfortably?: Not walking  Pain Assessment Currently in Pain?: Yes    Pt is coming to sit to stand with mod assist of two.  Once up pt is able to stand with min assist.  Max time standing is 4 minutes. Assessment RLE AROM (degrees) Right Hip Extension 0-30:  (AROM to neutral; PROM 10) Right Hip Flexion 0-125: 30  (PROM 55) Right Hip ABduction 0-45:  (AROM can move leg 3 degrees ; PROM10  (leg rests in 8 degree adduction) Right Hip ADduction 0-25:  (rests in 8 degrees adduction; unable to adduct further due L) RLE Strength Right Hip Flexion: 2/5 Right Hip Extension: 2/5 Right Hip ABduction: 2-/5 Right Hip ADduction: 2+/5 LLE AROM (degrees) Left Hip Extension 0-30:  (AROM 15 degrees from neutral; PROM 2 degrees from neutral) Left Hip Flexion 0-125: 30  (PROM 68) Left Hip ABduction 0-45:  (active 5; PROM 10) Left Hip ADduction 0-25: 10  LLE Strength Left Hip Flexion: 2+/5 Left Hip Extension: 2+/5 Left Hip ABduction: 2+/5 Left Hip ADduction: 3-/5   Physical Therapy Assessment and Plan PT Assessment and Plan Clinical Impression Statement: Pt ROM is slowly improving.  Pt has begun standing on B prothesis. Pt continues to need skilled PT to increase I in donning prothesis: improving ROM and strength to allow pt to ambulate with B prothesis in the future. Rehab Potential: Good PT Frequency: Min 3X/week PT Duration: 4 weeks PT  Treatment/Interventions: Gait training;Balance training;Therapeutic exercise;DME instruction;Other (comment) (prothesitic training.)    Goals  Pt to gain 5 degrees AROM; Pt to gain 1/2 grade strength; Pt to begin ambulating in // with mod assist of 2 x 5 ft x 2.  Problem List Patient Active Problem List  Diagnoses  . Stiffness of joint, not elsewhere classified, multiple sites  . Weakness of both legs  . Contracture of hip joint  . Other general symptoms    PT - End of Session Activity Tolerance: Patient tolerated treatment well General Behavior During Session: [facility] for tasks performed Cognition: [facility] for tasks performed PT Plan of Care PT Home Exercise Plan: Continue to see pt for exercise and prosthetic training.   RUSSELL,[name] 08/19/2011, 12:21 PM  Physician Documentation Your signature is required to indicate approval of the treatment plan as stated above.  Please sign and either send electronically or make a copy of this report for your files and return this physician signed original.   Please mark one 1.__approve of plan  2. ___approve of plan with the following conditions.   ______________________________                                                          _____________________ Physician Signature  Date  

## 2011-08-22 ENCOUNTER — Ambulatory Visit (HOSPITAL_COMMUNITY): Payer: Medicare Other | Admitting: *Deleted

## 2011-08-24 ENCOUNTER — Ambulatory Visit (HOSPITAL_COMMUNITY)
Admission: RE | Admit: 2011-08-24 | Discharge: 2011-08-24 | Disposition: A | Discharge status: Home / Self Care | Payer: Medicare Other | Source: Ambulatory Visit | Attending: Family Medicine | Admitting: Family Medicine

## 2011-08-24 NOTE — Progress Notes (Signed)
Physical Therapy Treatment Patient Details  Name: Robert Campbell MRN: [id number] Date of Birth: 09-Mar-1971  Today's Date: 08/24/2011 Time: 1004 (Pt startede by Lenora Boys, PTT. Donning prostheses [10:10-1015])-1112 Time Calculation (min): 68 min Visit#: 32  of 43   Re-eval: 09/18/11 Charges: Gait x 10' Therex x 22' Prosthetic training x 8'  Subjective: Symptoms/Limitations Symptoms: Pt reports conitnued HEP compliance. He wore the abduction wedge all weekend. Pain Assessment Currently in Pain?: No/denies   Exercise/Treatments Mobility/Balance  Transfers Sit to Stand: 4: Min assist;3: Mod assist Sit to Stand Details (indicate cue type and reason): Completed 3 times. Pt required min assist on 1st and 2nd transfer. 3rd transfer required mad assist. 2 people assisted for safety. (4:10, 3:15, 3:25). Stand to Sit: 4: Min assist Ambulation/Gait Ambulation/Gait Assistance: 4: Min assist (inside // bars; with 2 people for safety) Ambulation Distance (Feet): 2 Feet (taking small steps backward to WC)     Seated Other Seated Knee Exercises: abd/add AROM while resting after sit-stand 3x10 Supine Other Supine Knee Exercises: PROM to increase hip flex x 1' each Other Supine Knee Exercises: ABD stretch with pillow between thighs x 3' Prone  Contract/Relax to Increase Flexion: PROM for hip extension x1'  Physical Therapy Assessment and Plan PT Assessment and Plan Clinical Impression Statement: Pt continues to display improvements in strength ROM. Increased abd ROM noted secondary to abd wedge use at home. Began taking 1 hand off at a time with standing. Pt unable to keep hand up longer than 1 seconday. Pt gave instructions in donning/doffing prostheses.Pt toelrates tx well.  PT Plan: Continue to progress per PT POC.     Problem List Patient Active Problem List  Diagnoses  . Stiffness of joint, not elsewhere classified, multiple sites  . Weakness of both legs  . Contracture of hip  joint  . Other general symptoms    PT - End of Session Activity Tolerance: Patient tolerated treatment well General Behavior During Session: [facility] for tasks performed Cognition: [facility] for tasks performed  [name] 08/24/2011, 12:11 PM

## 2011-08-26 ENCOUNTER — Ambulatory Visit (HOSPITAL_COMMUNITY)
Admission: RE | Admit: 2011-08-26 | Discharge: 2011-08-26 | Disposition: A | Discharge status: Home / Self Care | Payer: Medicare Other | Source: Ambulatory Visit | Attending: Family Medicine | Admitting: Family Medicine

## 2011-08-26 DIAGNOSIS — R29898 Other symptoms and signs involving the musculoskeletal system: Secondary | ICD-10-CM

## 2011-08-26 DIAGNOSIS — M2560 Stiffness of unspecified joint, not elsewhere classified: Secondary | ICD-10-CM

## 2011-08-26 DIAGNOSIS — R6889 Other general symptoms and signs: Secondary | ICD-10-CM

## 2011-08-26 DIAGNOSIS — M24559 Contracture, unspecified hip: Secondary | ICD-10-CM

## 2011-08-26 NOTE — Evaluation (Signed)
Physical Therapy Evaluation  Patient Details  Name: Robert Campbell MRN: [id number] Date of Birth: 1970/09/17  Today's Date: 08/26/2011 This note is in error please see treatment note.  RUSSELL,[name] 08/26/2011, 12:20 PM  Physician Documentation Your signature is required to indicate approval of the treatment plan as stated above.  Please sign and either send electronically or make a copy of this report for your files and return this physician signed original.   Please mark one 1.__approve of plan  2. ___approve of plan with the following conditions.   ______________________________                                                          _____________________ Physician Signature                                                                                                             Date

## 2011-08-26 NOTE — Progress Notes (Signed)
Physical Therapy Treatment Patient Details  Name: Innis Camire MRN: [id number] Date of Birth: 1971/05/29  Today's Date: 08/26/2011 Time: 1010-1108 Time Calculation (min): 58 min Visit#: 33  of 44   Re-eval: 09/16/11    Subjective: Symptoms/Limitations Symptoms: Pt able to increase to the larger AB pad.    Exercise/Treatments      Stretches Passive Hamstring Stretch Limitations: PROM to all hip mm B    Seated Other Seated Knee Exercises: seated back against therapist pull off then press into. Supine Hip Adduction Isometric: 15 reps Straight Leg Raises: AAROM;15 reps Sidelying Hip ABduction: Strengthening;15 reps;Limitations Hip ABduction Limitations: 5# Hip ADduction: Strengthening;15 reps Other Sidelying Knee Exercises:  (hip flex/ext x 15 reps) Prone  Hip Extension: AAROM;15 reps Other Prone Exercises: press up with curling x 15/    Goals    Problem List Patient Active Problem List  Diagnoses  . Stiffness of joint, not elsewhere classified, multiple sites  . Weakness of both legs  . Contracture of hip joint  . Other general symptoms    PT Plan of Care PT Home Exercise Plan: Have Fridays be strengthening and stretching only to show better gains  RUSSELL,[name] 08/26/2011, 12:23 PM

## 2011-08-29 ENCOUNTER — Ambulatory Visit (HOSPITAL_COMMUNITY)
Admission: RE | Admit: 2011-08-29 | Discharge: 2011-08-29 | Disposition: A | Discharge status: Home / Self Care | Payer: Medicare Other | Source: Ambulatory Visit | Attending: Family Medicine | Admitting: Family Medicine

## 2011-08-29 NOTE — Progress Notes (Signed)
Physical Therapy Treatment Patient Details  Name: Robert Campbell MRN: [id number] Date of Birth: Jun 30, 1971  Today's Date: 08/29/2011 Time: [id number] Time Calculation (min): 61 min Visit#: 34  of 44   Re-eval: 09/16/11 Charges: Therex x 20' Gait x 10' Prosthetic training x 8'  Subjective: Symptoms/Limitations Symptoms: Pt states that he is not currently having any pain but he had 8/10 pain in the distal portion of his R residual limb. He attributes this pain to the large wedge. He states the wedge is helping. Pain Assessment Currently in Pain?: No/denies   Exercise/Treatments Mobility/Balance  Transfers Sit to Stand: 4: Min assist Sit to Stand Details (indicate cue type and reason): Pt able to stand for longer period of time but decreased repetitions. 1st try: 4'15" 2nd try 5'20". Stand to Sit: 4: Min assist Ambulation/Gait Ambulation/Gait Assistance: 4: Min assist (inside // bars; with 2 people for safety) Ambulation Distance (Feet): 2 Feet (taking small steps backward to WC)     Seated Long Arc Quad: Limitations Long Arc Quad Limitations: Seated hip flexion x 10 B  Other Seated Knee Exercises: abd/add AROM while resting after sit-stand x 20 Other Seated Knee Exercises: seated abd stretch x 3'  Physical Therapy Assessment and Plan PT Assessment and Plan Clinical Impression Statement: Pt displays significant improvement in hip abd ROM. Pt able to stand for 5'20" today. Pt complained of dizziness at end of session. Pt reports that he ate breakfast and took his blood pressure medicine this morning. Blood pressure was 148/100. Pt states that his blood pressure usually runs high. Dizziness subsided with rest. Pt states that he has an electronic blood pressure cuff at home. Pt advised to take blood pressure at home and if it does not go down to contact his MD. Pt reports no increase in pain at end of session. PT Plan: Continue to progress per PT POC.     Problem List Patient  Active Problem List  Diagnoses  . Stiffness of joint, not elsewhere classified, multiple sites  . Weakness of both legs  . Contracture of hip joint  . Other general symptoms    PT - End of Session Activity Tolerance: Patient tolerated treatment well General Behavior During Session: [facility] for tasks performed Cognition: [facility] for tasks performed  [name] 08/29/2011, 12:15 PM

## 2011-08-31 ENCOUNTER — Ambulatory Visit (HOSPITAL_COMMUNITY)
Admission: RE | Admit: 2011-08-31 | Discharge: 2011-08-31 | Disposition: A | Discharge status: Home / Self Care | Payer: Medicare Other | Source: Ambulatory Visit | Attending: Family Medicine | Admitting: Family Medicine

## 2011-08-31 NOTE — Progress Notes (Signed)
Physical Therapy Treatment Patient Details  Name: Robert Campbell MRN: [id number] Date of Birth: March 22, 1971  Today's Date: 08/31/2011 Time: 0950-1100 Time Calculation (min): 70 min Visit#: 35  of 70   Re-eval: 09/16/11 Charges: Gait x 12' Therex x 20' Prosthetic training x 15'  Subjective: Symptoms/Limitations Symptoms: "My prostheses just don't right today."(After standing) Pain Assessment Currently in Pain?: No/denies   Exercise/Treatments Mobility/Balance  Transfers Sit to Stand: 4: Min assist Sit to Stand Details (indicate cue type and reason): Pt had difficulty with prostheses fitting properly today. By last attempt pt states prostheses felt comfortable. (1'10", 1'20", [id number]", 2'25",2'12") Attempted UE lift while standing . Pt able to hold 2" max with 1 UE. Stand to Sit: 4: Min assist Ambulation/Gait Ambulation/Gait Assistance: 4: Min assist;Other (comment) (inside // bars; with 2 people for safety) Ambulation Distance (Feet): 2 Feet (taking small steps backward to WC)    Seated Heel Slides: Limitations Heel Slides Limitations: Seated hip flexion stretch with forward UE reach 3 x 15" Other Seated Knee Exercises: abd/add AROM while resting after sit-stand 3x10 Other Seated Knee Exercises: seated abd stretch x 3 B'; flexion stretch x3' B  Physical Therapy Assessment and Plan PT Assessment and Plan Clinical Impression Statement: Pt had difficulty this session with prostheses fitting properly. PTA had to assist in donning and doffing prostheses twice to readjust. By end of session pt states they felt like they fit better. Pt continues to display improved abduction ROM. Pt advised to bring chair back to sit straighter to improve hip flexion so that pt will eventually be able to sit unsupported. Pt states no increase in pain at end of session.  PT Plan: Continue to progress per PT POC. Continue to attempt standing while unweighting 1 UE.     Problem List Patient Active Problem  List  Diagnoses  . Stiffness of joint, not elsewhere classified, multiple sites  . Weakness of both legs  . Contracture of hip joint  . Other general symptoms    PT - End of Session Activity Tolerance: Patient tolerated treatment well General Behavior During Session: [facility] for tasks performed Cognition: [facility] for tasks performed  [name] 08/31/2011, 12:08 PM

## 2011-09-02 ENCOUNTER — Ambulatory Visit (HOSPITAL_COMMUNITY)
Admission: RE | Admit: 2011-09-02 | Discharge: 2011-09-02 | Disposition: A | Discharge status: Home / Self Care | Payer: Medicare Other | Source: Ambulatory Visit | Attending: Family Medicine | Admitting: Family Medicine

## 2011-09-02 DIAGNOSIS — R6889 Other general symptoms and signs: Secondary | ICD-10-CM

## 2011-09-02 DIAGNOSIS — R29898 Other symptoms and signs involving the musculoskeletal system: Secondary | ICD-10-CM

## 2011-09-02 DIAGNOSIS — M2560 Stiffness of unspecified joint, not elsewhere classified: Secondary | ICD-10-CM

## 2011-09-02 DIAGNOSIS — M24559 Contracture, unspecified hip: Secondary | ICD-10-CM

## 2011-09-02 NOTE — Progress Notes (Signed)
Physical Therapy Treatment Patient Details  Name: Robert Campbell MRN: [id number] Date of Birth: June 01, 1971  Today's Date: 09/02/2011 Time: [id number] Time Calculation (min): 75 min Visit#: 36  of 44   Re-eval: 09/16/11  Charge: therex: 68 min  Subjective: Symptoms/Limitations Symptoms: Been using the wedge/abduction a lot at home, been loosing weight.  No pain. Pain Assessment Currently in Pain?: No/denies  Precautions/Restrictions     Exercise/Treatments Stretches Passive Hamstring Stretch Limitations: Passive h/s B   3x 30" Supine Straight Leg Raises: AAROM;15 reps Sidelying Hip ABduction: Strengthening;15 reps;Limitations Hip ABduction Limitations: 5# Other Sidelying Knee Exercises: SL hip flex/ext 15 reps x 15-20" holds Prone  Hip Extension: AAROM;15 reps Other Prone Exercises: press up with curling x 15/      Physical Therapy Assessment and Plan PT Assessment and Plan Clinical Impression Statement: Session focused on hip strengthening/stretching with manual assistance to reduce compensation and to assist weakness.  Pt tolerated well towards session. PT Plan: Continue to progress per PT POC. Continue to attempt standing while unweighting 1 UE.    Goals    Problem List Patient Active Problem List  Diagnoses  . Stiffness of joint, not elsewhere classified, multiple sites  . Weakness of both legs  . Contracture of hip joint  . Other general symptoms    PT - End of Session Activity Tolerance: Patient tolerated treatment well General Behavior During Session: [facility] for tasks performed Cognition: [facility] for tasks performed  Aldona Lento 09/02/2011, 12:15 PM

## 2011-09-05 ENCOUNTER — Ambulatory Visit (HOSPITAL_COMMUNITY)
Admission: RE | Admit: 2011-09-05 | Discharge: 2011-09-05 | Disposition: A | Discharge status: Home / Self Care | Payer: Medicare Other | Source: Ambulatory Visit | Attending: Family Medicine | Admitting: Family Medicine

## 2011-09-05 NOTE — Progress Notes (Signed)
Physical Therapy Treatment Patient Details  Name: Robert Campbell MRN: [id number] Date of Birth: 02-11-1971  Today's Date: 09/05/2011 Time: [id number] Time Calculation (min): 40 min Visit#: 37  of 44   Re-eval: 09/16/11 Charges: Gait x 11' Therex x 25'  Subjective: Symptoms/Limitations Symptoms: I feel like I'm really doing better. Everything you all have been doing is helping. Pain Assessment Currently in Pain?: No/denies   Exercise/Treatments Mobility/Balance  Transfers Sit to Stand: 4: Min assist Sit to Stand Details (indicate cue type and reason): 6'10", 1'25", 1'30", 1'15" Stand to Sit: 4: Min assist Ambulation/Gait Ambulation/Gait Assistance: 3: Mod assist (inside // bars; with 2 people for safety) Ambulation Distance (Feet):  (4 steps fwd/ 4 steps back x 2)     Seated Heel Slides Limitations: Seated hip flexion stretch with forward UE reach 4 x 15" Other Seated Knee Exercises: abd/add AROM while resting after sit-stand 3x10 Other Seated Knee Exercises: seated abd stretch x 3' B   Physical Therapy Assessment and Plan PT Assessment and Plan Clinical Impression Statement: Pt able to take steps this session. Pt displays improved LE control. Pt able to stand for 6'10". Pt reports improved prostheses fit this session. PT Plan: Continue to progress per PT POC.    Problem List Patient Active Problem List  Diagnoses  . Stiffness of joint, not elsewhere classified, multiple sites  . Weakness of both legs  . Contracture of hip joint  . Other general symptoms    PT - End of Session Activity Tolerance: Patient tolerated treatment well General Behavior During Session: [facility] for tasks performed Cognition: [facility] for tasks performed  [name] 09/05/2011, 12:20 PM

## 2011-09-07 ENCOUNTER — Ambulatory Visit (HOSPITAL_COMMUNITY)
Admission: RE | Admit: 2011-09-07 | Discharge: 2011-09-07 | Disposition: A | Discharge status: Home / Self Care | Payer: Medicare Other | Source: Ambulatory Visit | Attending: Family Medicine | Admitting: Family Medicine

## 2011-09-07 DIAGNOSIS — M2560 Stiffness of unspecified joint, not elsewhere classified: Secondary | ICD-10-CM

## 2011-09-07 DIAGNOSIS — M24559 Contracture, unspecified hip: Secondary | ICD-10-CM

## 2011-09-07 DIAGNOSIS — R29898 Other symptoms and signs involving the musculoskeletal system: Secondary | ICD-10-CM

## 2011-09-07 DIAGNOSIS — R6889 Other general symptoms and signs: Secondary | ICD-10-CM

## 2011-09-07 NOTE — Progress Notes (Signed)
Physical Therapy Treatment Patient Details  Name: Robert Campbell MRN: [id number] Date of Birth: 1970/08/28  Today's Date: 09/07/2011 Time: 1020-1105 Time Calculation (min): 45 min Visit#: 38  of 44   Re-eval: 09/16/11    Subjective: Symptoms/Limitations Symptoms: I walked last time Pain Assessment Currently in Pain?: No/denies  Precautions/Restrictions   falls  Exercise/Treatments Mobility/Balance  Transfers Sit to Stand: 5: Supervision Sit to Stand Details (indicate cue type and reason):  (Pt stood five times; times vary from 3:2 to 1:45) Ambulation/Gait Ambulation/Gait: Yes Ambulation/Gait Assistance: 4: Min assist Ambulation Distance (Feet):  (5 feet forward 5 feet backward x 3.5) Assistive device: Parallel bars (2 people SBA) Gait Pattern: Step-to pattern;Decreased step length - right;Decreased step length - left Gait velocity:  (slow)      Seated Heel Slides Limitations: Seated trunk flexion x 45" x 4 Other Seated Knee Exercises: AROM/PROM for hip abduction x 1' x 3 Other Seated Knee Exercises: W/C pushups x 20     Physical Therapy Assessment and Plan PT Assessment and Plan Clinical Impression Statement: Pt able to walk for five feet x 3.5 reps.  Goal is to ambulate at // forward and back x 3 rep then we will attempt walker sessions Rehab Potential: Good PT Plan: Attempt 2 RT at // on Monday    Goals    Problem List Patient Active Problem List  Diagnoses  . Stiffness of joint, not elsewhere classified, multiple sites  . Weakness of both legs  . Contracture of hip joint  . Other general symptoms    PT - End of Session Equipment Utilized During Treatment: Gait belt Activity Tolerance: Patient tolerated treatment well General Behavior During Session: [facility] for tasks performed Cognition: [facility] for tasks performed PT Plan of Care Consulted and Agree with Plan of Care: Patient  GP No functional reporting required  RUSSELL,[name] 09/07/2011, 12:21  PM

## 2011-09-08 ENCOUNTER — Telehealth (HOSPITAL_COMMUNITY): Payer: Self-pay

## 2011-09-09 ENCOUNTER — Ambulatory Visit (HOSPITAL_COMMUNITY): Payer: Medicare Other | Admitting: Physical Therapy

## 2011-09-12 ENCOUNTER — Ambulatory Visit (HOSPITAL_COMMUNITY)
Admission: RE | Admit: 2011-09-12 | Discharge: 2011-09-12 | Disposition: A | Discharge status: Home / Self Care | Payer: Medicare Other | Source: Ambulatory Visit | Attending: Family Medicine | Admitting: Family Medicine

## 2011-09-12 NOTE — Progress Notes (Signed)
Physical Therapy Treatment Patient Details  Name: Robert Campbell MRN: [id number] Date of Birth: May 18, 1971  Today's Date: 09/12/2011 Time: 1000-1100 Pt started by Lenora Boys, PTT 1000-1011 Time Calculation (min): 60 min Visit#: 39  of 44   Re-eval: 09/16/11 Charges: Therex x 43'  Subjective: Symptoms/Limitations Symptoms: Pt states that he has began having pain in his R residual limb that feels like phantom limb pain. Pt states it started this mornign when he woke up.   Exercise/Treatments Seated Heel Slides Limitations: Seated trunk flexion back to back with therapist reaching forward Supine Straight Leg Raises: AAROM;10 reps Other Supine Knee Exercises: PROM to increase hip flex 2 x 1' each Other Supine Knee Exercises: Abd stretch 2 x 30" Sidelying Hip ABduction: 15 reps (No wt focusing on hip stability) Hip ADduction: 15 reps (No wt focusing on hip stability) Prone  Contract/Relax to Increase Flexion: PROM for hip extension 2x1' B Other Prone Exercises: press upx 10 Other Prone Exercises: POE x 3'   Physical Therapy Assessment and Plan PT Assessment and Plan Clinical Impression Statement: Standing held this session secondary to phantom limb pain. Pt states that after stretches and exercises pain decreased to 2/10. PT continues to show gains in ROM and strength. Pt advised to contact MD if phantom pain returns. PT Plan: Continue to progress per PT POC.     Problem List Patient Active Problem List  Diagnoses  . Stiffness of joint, not elsewhere classified, multiple sites  . Weakness of both legs  . Contracture of hip joint  . Other general symptoms    PT - End of Session Activity Tolerance: Patient tolerated treatment well General Behavior During Session: [facility] for tasks performed Cognition: [facility] for tasks performed   [name] 09/12/2011, 12:09 PM

## 2011-09-14 ENCOUNTER — Ambulatory Visit (HOSPITAL_COMMUNITY)
Admission: RE | Admit: 2011-09-14 | Discharge: 2011-09-14 | Disposition: A | Discharge status: Home / Self Care | Payer: Medicare Other | Source: Ambulatory Visit | Attending: Family Medicine | Admitting: Family Medicine

## 2011-09-14 DIAGNOSIS — R29898 Other symptoms and signs involving the musculoskeletal system: Secondary | ICD-10-CM

## 2011-09-14 DIAGNOSIS — R6889 Other general symptoms and signs: Secondary | ICD-10-CM

## 2011-09-14 DIAGNOSIS — M24559 Contracture, unspecified hip: Secondary | ICD-10-CM

## 2011-09-14 DIAGNOSIS — M2560 Stiffness of unspecified joint, not elsewhere classified: Secondary | ICD-10-CM

## 2011-09-14 NOTE — Progress Notes (Signed)
Physical Therapy Treatment Patient Details  Name: Robert Campbell MRN: [id number] Date of Birth: [birth year]  Today's Date: 09/14/2011 Time: 1000-1118 Time Calculation (min): 78 min Visit#: 40  of 44   Re-eval: 09/16/11  Charge: therex: 65 min  Subjective: Symptoms/Limitations Symptoms: Pt came into dept, his bus driver arrived earlier this morning and he did not get a chance to take blood pressure meds; BP at entrance was 178/83.  Pt asked to complete mat activites only today. Pain Assessment Currently in Pain?: No/denies  Objective:   Exercise/Treatments Supine Straight Leg Raises: AAROM;10 reps;Limitations Straight Leg Raises Limitations: with eccentric lowering Other Supine Knee Exercises: 15 situps Sidelying Hip ABduction: 15 reps Hip ADduction: 15 reps Prone  Hip Extension: AAROM;15 reps Other Prone Exercises: held secodnary to increased BP Other Prone Exercises: held secodnary to increased BP      Physical Therapy Assessment and Plan PT Assessment and Plan Clinical Impression Statement: Standing exercises held today per pt.'s request due to increased blood pressures.  Increased flexibility and bed mobility noted following therex and manual stretches. PT Plan: Reassess next session.     Goals    Problem List Patient Active Problem List  Diagnoses  . Stiffness of joint, not elsewhere classified, multiple sites  . Weakness of both legs  . Contracture of hip joint  . Other general symptoms    PT - End of Session Activity Tolerance: Patient tolerated treatment well General Behavior During Session: [facility] for tasks performed Cognition: [facility] for tasks performed  Aldona Lento, PTA  Aldona Lento 09/14/2011, 1:38 PM

## 2011-09-16 ENCOUNTER — Ambulatory Visit (HOSPITAL_COMMUNITY)
Admission: RE | Admit: 2011-09-16 | Discharge: 2011-09-16 | Disposition: A | Discharge status: Home / Self Care | Payer: Medicare Other | Source: Ambulatory Visit | Attending: Family Medicine | Admitting: Family Medicine

## 2011-09-16 DIAGNOSIS — M6281 Muscle weakness (generalized): Secondary | ICD-10-CM | POA: Insufficient documentation

## 2011-09-16 DIAGNOSIS — Z5189 Encounter for other specified aftercare: Secondary | ICD-10-CM | POA: Insufficient documentation

## 2011-09-16 DIAGNOSIS — R269 Unspecified abnormalities of gait and mobility: Secondary | ICD-10-CM | POA: Insufficient documentation

## 2011-09-16 DIAGNOSIS — S78119A Complete traumatic amputation at level between unspecified hip and knee, initial encounter: Secondary | ICD-10-CM | POA: Insufficient documentation

## 2011-09-16 NOTE — Patient Instructions (Addendum)
Work on supine hip flexion and abduction at home

## 2011-09-16 NOTE — Evaluation (Signed)
Physical Therapy Evaluation  Patient Details  Name: Robert Campbell MRN: [id number] Date of Birth: [birth year]  Today's Date: 09/16/2011 Time: 1000-1102 Time Calculation (min): 76 min  Visit#: 41  of 88   Re-eval: 09/28/11    Past Medical History: No past medical history on file. Past Surgical History: No past surgical history on file.  Subjective Symptoms/Limitations Symptoms: Pt states that he has not had any pain since last week. Pain Assessment Currently in Pain?: No/denies  Precautions/Restrictions   falls    Assessment RLE AROM (degrees) Right Hip Extension 0-30:  (AROM to 3 ; PROM to 10) Right Hip Flexion 0-125:  (PROM 55; AROM 20 degrees without substitution) Right Hip ABduction 0-45:  (rests in 5 degree add; able to abduct to neutral) Right Hip ADduction 0-25:  (rests in 5 degree adduction was 8) RLE Strength Right Hip Flexion: 2/5 (was 2/5) Right Hip Extension: 2+/5 (was a 2/5) Right Hip ABduction: 2/5 (pt was 2-/5) Right Hip ADduction: 2+/5 LLE AROM (degrees) Left Hip Extension 0-30:  (AROM 15 degrees from neutral; PROM 2 degrees.) Left Hip Flexion 0-125:  (35 degrees active; PROM 65 painful) Left Hip ABduction 0-45:  (leg rests in 10 degrees add. AROM to 2 degree; PROM 15) Left Hip ADduction 0-25: 10  LLE Strength Left Hip Flexion: 3-/5 (was 2+/5) Left Hip Extension: 2-/5 Left Hip ABduction:  (2/5 was 2+/5) Left Hip ADduction: 3-/5  Exercise/Treatments Mobility/Balance  Transfers Sit to Stand: 4: Min assist Stand to Sit: 4: Min assist Lateral/Scoot Transfers: 6: Modified independent (Device/Increase time) Ambulation/Gait Ambulation/Gait Assistance: 4: Min assist Ambulation Distance (Feet):  (5 feet) previously patient had not attempted ambulation Assistive device: Parallel bars Gait Pattern: Step-to pattern Gait velocity:  (slow)     Supine Straight Leg Raises: AAROM;Both;15 reps Straight Leg Raises Limitations: PROM Other Supine Knee  Exercises: AB/ADDx 15 B Other Supine Knee Exercises: PROM for abductionand flexion   Prone  Hip Extension: AAROM;Both;15 reps   Physical Therapy Assessment and Plan PT Assessment and Plan Clinical Impression Statement: Pt has increased in ROM and strength in various mm; please see note.  Pt has begun ambulating at // only able to ambulate five feet prior to needing to sit back down. Rehab Potential: Good Clinical Impairments Affecting Rehab Potential: Pt continues to have weak mm contractures and limited mobility PT Frequency: Min 3X/week PT Duration: 8 weeks PT Treatment/Interventions: Gait training;Balance training;DME instruction;Therapeutic exercise PT Plan: continue to see pt working on functinal ambulation    Goals Home Exercise Program PT Goal: Perform Home Exercise Program - Progress: Met PT Short Term Goals Time to Complete Short Term Goals: 4 weeks PT Short Term Goal 1 - Progress: Progressing toward goal PT Short Term Goal 2 - Progress: Met PT Short Term Goal 3: New short term goal as of 09/16/11- Increae mm strength by 1/2 grade form 3/1 strength. PT Short Term Goal 4: Pt to be able to ambulate with min assist at // x 10 feet without sitting down. PT Long Term Goals Time to Complete Long Term Goals: 8 weeks PT Long Term Goal 1 - Progress: Met PT Long Term Goal 2 - Progress: Met Long Term Goal 5 Progress: Met Additional PT Long Term Goals?: Yes PT Long Term Goal 6: New goal as of 3/1- Increase ROM of LE by 10 degrees actively from 3/1/measurements PT Long Term Goal 7: Increase mm strength by one grade from 3/1  PT Long Term Goal 8: Begin sit to stand from w/c  to walker to progress to functional state.   Problem List Patient Active Problem List  Diagnoses  . Stiffness of joint, not elsewhere classified, multiple sites  . Weakness of both legs  . Contracture of hip joint  . Other general symptoms    PT - End of Session Activity Tolerance: Patient tolerated treatment  well General Behavior During Session: [facility] for tasks performed Cognition: [facility] for tasks performed PT Plan of Care PT Home Exercise Plan: Pt to concentrate on hip abduction and hip flexion B Consulted and Agree with Plan of Care: Patient  GP  Functional Reporting Modifier  Current Status  [phone] - Mobility: Walking & Moving Around CM - At least 80% but less than 100% impaired, limited or restricted  Goal Status  [id number] - Mobility: [place] & Moving Around CK - At least 40% but less than 60% impaired, limited or restricted   Edi Gorniak,[name] 09/16/2011, 12:31 PM  Physician Documentation Your signature is required to indicate approval of the treatment plan as stated above.  Please sign and either send electronically or make a copy of this report for your files and return this physician signed original.   Please mark one 1.__approve of plan  2. ___approve of plan with the following conditions.   ______________________________                                                          _____________________ Physician Signature                                                                                                             Date

## 2011-09-19 ENCOUNTER — Ambulatory Visit (HOSPITAL_COMMUNITY)
Admission: RE | Admit: 2011-09-19 | Discharge: 2011-09-19 | Disposition: A | Discharge status: Home / Self Care | Payer: Medicare Other | Source: Ambulatory Visit | Attending: Family Medicine | Admitting: Family Medicine

## 2011-09-19 DIAGNOSIS — R6889 Other general symptoms and signs: Secondary | ICD-10-CM

## 2011-09-19 DIAGNOSIS — R29898 Other symptoms and signs involving the musculoskeletal system: Secondary | ICD-10-CM

## 2011-09-19 DIAGNOSIS — M2560 Stiffness of unspecified joint, not elsewhere classified: Secondary | ICD-10-CM

## 2011-09-19 DIAGNOSIS — M24559 Contracture, unspecified hip: Secondary | ICD-10-CM

## 2011-09-19 NOTE — Progress Notes (Signed)
Physical Therapy Treatment Patient Details  Name: Jie Mccoin MRN: [id number] Date of Birth: [birth year]  Today's Date: 09/19/2011 Time: [id number] Time Calculation (min): 48 min Visit#: 42  of 53   Re-eval: 10/17/11    Subjective: Symptoms/Limitations Symptoms: Pt states he is getting a new w/c counseled pt to see if he could request one with full arm length to assist in sit to stand activity once we begin ambulation with a walker  Precautions/Restrictions   falls  Exercise/Treatments Mobility/Balance  Transfers Transfers: Yes Sit to Stand: 5: Supervision;With upper extremity assist (Pt tends to pull himself to upright position from // worked to try and get pt to push up from w/c to standing position.) Stand to Sit: 5: Supervision Ambulation/Gait Ambulation/Gait: Yes Ambulation/Gait Assistance: 4: Min assist Ambulation Distance (Feet): 5 Feet (5' forward 5' back x 4) Assistive device: Parallel bars Gait Pattern: Step-to pattern Gait velocity: slow Stairs: No Door Management: Not tested (comment) Ramp: Not tested (comment) Curb: Not tested (comment)     Seated Heel Slides Limitations:  (seated assisted trunk flexion 5x x3) Other Seated Knee Exercises: AROM and PROM hip ab/add x 10x 2 Other Seated Knee Exercises: W/C pushup   Physical Therapy Assessment and Plan PT Assessment and Plan Clinical Impression Statement: Pt improving in step length.  Pt fatigues with walking quickly Rehab Potential: Good PT Frequency: Min 3X/week PT Duration: 4 weeks PT Plan: next treatment walk length of // and back plus two steps prior to sitting down.    Goals    Problem List Patient Active Problem List  Diagnoses  . Stiffness of joint, not elsewhere classified, multiple sites  . Weakness of both legs  . Contracture of hip joint  . Other general symptoms    PT - End of Session Equipment Utilized During Treatment: Gait belt Activity Tolerance: Patient tolerated treatment  well General Behavior During Session: [facility] for tasks performed Cognition: [facility] for tasks performed PT Plan of Care PT Home Exercise Plan: Continue with HEP.  Have aide put a pillow in the back of his chair to increase hip flexion when sitting. Consulted and Agree with Plan of Care: Patient  GP No functional reporting required  Marlowe Cinquemani,[name] 09/19/2011, 10:38 AM

## 2011-09-21 ENCOUNTER — Ambulatory Visit (HOSPITAL_COMMUNITY)
Admission: RE | Admit: 2011-09-21 | Discharge: 2011-09-21 | Disposition: A | Discharge status: Home / Self Care | Payer: Medicare Other | Source: Ambulatory Visit | Attending: Family Medicine | Admitting: Family Medicine

## 2011-09-21 NOTE — Progress Notes (Signed)
Physical Therapy Treatment Patient Details  Name: Robert Campbell MRN: [id number] Date of Birth: Jul 24, 1970  Today's Date: 09/21/2011 Time: 1007-1100 Time Calculation (min): 53 min Visit#: 43  of 53   Re-eval: 10/17/11 Charges: Therex x 20' Gait x 20'  Subjective: Symptoms/Limitations Symptoms: Pt states that he has been getting out of the house with friends. Pain Assessment Currently in Pain?: No/denies   Exercise/Treatments Mobility/Balance  Transfers Sit to Stand: 5: Supervision;With upper extremity assist Stand to Sit: 5: Supervision Ambulation/Gait Ambulation Distance (Feet):  (5' forward and back 5x with extra step fwd/back on last try) Assistive device: Parallel bars   Seated Other Seated Knee Exercises: PROM hip ab/flex  Other Seated Knee Exercises: AROM abduction 2 x 10  Physical Therapy Assessment and Plan PT Assessment and Plan Clinical Impression Statement: Pt displays improved endurance. Pt requests to walk one more time at end of session.  PT Plan: Continue to progress per PT POC.     Problem List Patient Active Problem List  Diagnoses  . Stiffness of joint, not elsewhere classified, multiple sites  . Weakness of both legs  . Contracture of hip joint  . Other general symptoms    PT - End of Session Activity Tolerance: Patient tolerated treatment well General Behavior During Session: [facility] for tasks performed Cognition: [facility] for tasks performed    Rachelle Hora, PTA 09/21/2011, 11:59 AM

## 2011-09-23 ENCOUNTER — Ambulatory Visit (HOSPITAL_COMMUNITY)
Admission: RE | Admit: 2011-09-23 | Discharge: 2011-09-23 | Disposition: A | Discharge status: Home / Self Care | Payer: Medicare Other | Source: Ambulatory Visit | Attending: Family Medicine | Admitting: Family Medicine

## 2011-09-23 NOTE — Progress Notes (Signed)
Physical Therapy Treatment Patient Details  Name: Robert Campbell MRN: [id number] Date of Birth: 04-09-1971  Today's Date: 09/23/2011 Time: [id number] Time Calculation (min): 65 min Visit#: 44  of 54   Re-eval: 10/17/11  There ex 53; There activity 13 Subjective: Symptoms/Limitations Symptoms: I'm tight in my chest.   Exercise/Treatments   Seated Other Seated Knee Exercises: Pt placed in sitting then support let go. Supine Straight Leg Raises: PROM;AAROM;10 reps Other Supine Knee Exercises: Ab/adduction x 15 and PROM Other Supine Knee Exercises: ab work Sidelying Other Sidelying Knee Exercises: flex/ext with stregtch Other Sidelying Knee Exercises: Ab with 5#; ADDution x 10. Prone  Hip Extension: AAROM Other Prone Exercises: back ext x 10 Other Prone Exercises: POE      Physical Therapy Assessment and Plan PT Assessment and Plan Clinical Impression Statement: Pt continues to tolerate treatment well.      Goals    Problem List Patient Active Problem List  Diagnoses  . Stiffness of joint, not elsewhere classified, multiple sites  . Weakness of both legs  . Contracture of hip joint  . Other general symptoms    PT - End of Session Activity Tolerance: Patient tolerated treatment well General Behavior During Session: [facility] for tasks performed Cognition: [facility] for tasks performed  GP No functional reporting required  Josealberto Montalto,[name] 09/23/2011, 12:21 PM

## 2011-09-23 NOTE — Patient Instructions (Signed)
Try prone back extension at home

## 2011-09-26 ENCOUNTER — Ambulatory Visit (HOSPITAL_COMMUNITY)
Admission: RE | Admit: 2011-09-26 | Discharge: 2011-09-26 | Disposition: A | Discharge status: Home / Self Care | Payer: Medicare Other | Source: Ambulatory Visit | Attending: Family Medicine | Admitting: Family Medicine

## 2011-09-26 NOTE — Progress Notes (Signed)
Physical Therapy Treatment Patient Details  Name: Robert Campbell MRN: [id number] Date of Birth: 1971-06-13  Today's Date: 09/26/2011 Time: 1020-1110 Time Calculation (min): 50 min Visit#: 45  of 54   Re-eval: 10/17/11 Charges: Therex x 15' Prosthetic training x 8' Gait x 18'  Subjective: Symptoms/Limitations Symptoms: I slept with the divider between my legs last night. Pain Assessment Currently in Pain?: No/denies   Exercise/Treatments Mobility/Balance  Transfers Sit to Stand: 5: Supervision Stand to Sit: 5: Supervision Ambulation/Gait Ambulation Distance (Feet):  (8' x 4) Assistive device: Parallel bars Gait Pattern: Step-to pattern Gait velocity: slow but increased compared to previous visits     Seated Other Seated Knee Exercises: PROM flexion and abduction 6x 15" Other Seated Knee Exercises: AROM abd 3x10  Physical Therapy Assessment and Plan PT Assessment and Plan Clinical Impression Statement: Attempted size 3 sock on R and 2 size 1 socks on L. Pt reports that this feels too tight on his R leg. Pt denies need to change sock this session but would like to try smaller sock next session. Pt displays improved endurance with gait. Pt is able to walk longer distance before sitting. Though the majority of pt's steps present as a step-to pattern he does display step through at times. Pt is without complaint of pain throughout session. PT Plan: Next session apply 2 size 1 socks to B LE when donning prostheses.     Problem List Patient Active Problem List  Diagnoses  . Stiffness of joint, not elsewhere classified, multiple sites  . Weakness of both legs  . Contracture of hip joint  . Other general symptoms    PT - End of Session Activity Tolerance: Patient tolerated treatment well General Behavior During Session: [facility] for tasks performed Cognition: [facility] for tasks performed  Rachelle Hora, PTA  09/26/2011, 11:31 AM

## 2011-09-28 ENCOUNTER — Ambulatory Visit (HOSPITAL_COMMUNITY): Payer: Medicare Other | Admitting: *Deleted

## 2011-09-29 ENCOUNTER — Emergency Department (HOSPITAL_COMMUNITY)
Admission: EM | Admit: 2011-09-29 | Discharge: 2011-09-29 | Disposition: A | Discharge status: Home Health | Payer: Medicare Other | Attending: Emergency Medicine | Admitting: Emergency Medicine

## 2011-09-29 ENCOUNTER — Emergency Department (HOSPITAL_COMMUNITY): Payer: Medicare Other

## 2011-09-29 ENCOUNTER — Encounter (HOSPITAL_COMMUNITY): Payer: Self-pay | Admitting: Emergency Medicine

## 2011-09-29 DIAGNOSIS — R072 Precordial pain: Secondary | ICD-10-CM | POA: Insufficient documentation

## 2011-09-29 DIAGNOSIS — W1789XA Other fall from one level to another, initial encounter: Secondary | ICD-10-CM | POA: Insufficient documentation

## 2011-09-29 DIAGNOSIS — S20219A Contusion of unspecified front wall of thorax, initial encounter: Secondary | ICD-10-CM | POA: Insufficient documentation

## 2011-09-29 DIAGNOSIS — M546 Pain in thoracic spine: Secondary | ICD-10-CM | POA: Insufficient documentation

## 2011-09-29 DIAGNOSIS — S20229A Contusion of unspecified back wall of thorax, initial encounter: Secondary | ICD-10-CM

## 2011-09-29 DIAGNOSIS — I7100 Dissection of unspecified site of aorta: Secondary | ICD-10-CM

## 2011-09-29 HISTORY — DX: Depression, unspecified: F32.A

## 2011-09-29 HISTORY — DX: Anxiety disorder, unspecified: F41.9

## 2011-09-29 HISTORY — DX: Essential (primary) hypertension: I10

## 2011-09-29 HISTORY — DX: Major depressive disorder, single episode, unspecified: F32.9

## 2011-09-29 LAB — CBC
HCT: 36.8 % — ABNORMAL LOW (ref 39.0–52.0)
Hemoglobin: 11.8 g/dL — ABNORMAL LOW (ref 13.0–17.0)
MCH: 28.3 pg (ref 26.0–34.0)
MCHC: 32.1 g/dL (ref 30.0–36.0)
MCV: 88.2 fL (ref 78.0–100.0)
Platelets: 228 10*3/uL (ref 150–400)
RBC: 4.17 MIL/uL — ABNORMAL LOW (ref 4.22–5.81)
RDW: 13.8 % (ref 11.5–15.5)
WBC: 10 10*3/uL (ref 4.0–10.5)

## 2011-09-29 LAB — DIFFERENTIAL
Basophils Absolute: 0 10*3/uL (ref 0.0–0.1)
Basophils Relative: 0 % (ref 0–1)
Eosinophils Absolute: 0.3 10*3/uL (ref 0.0–0.7)
Eosinophils Relative: 3 % (ref 0–5)
Lymphocytes Relative: 26 % (ref 12–46)
Lymphs Abs: 2.6 10*3/uL (ref 0.7–4.0)
Monocytes Absolute: 0.7 10*3/uL (ref 0.1–1.0)
Monocytes Relative: 7 % (ref 3–12)
Neutro Abs: 6.4 10*3/uL (ref 1.7–7.7)
Neutrophils Relative %: 64 % (ref 43–77)

## 2011-09-29 LAB — BASIC METABOLIC PANEL
BUN: 33 mg/dL — ABNORMAL HIGH (ref 6–23)
CO2: 29 mEq/L (ref 19–32)
Calcium: 9.6 mg/dL (ref 8.4–10.5)
Chloride: 100 mEq/L (ref 96–112)
Creatinine, Ser: 1.9 mg/dL — ABNORMAL HIGH (ref 0.50–1.35)
GFR calc Af Amer: 50 mL/min — ABNORMAL LOW (ref >90)
GFR calc non Af Amer: 43 mL/min — ABNORMAL LOW (ref >90)
Glucose, Bld: 88 mg/dL (ref 70–99)
Potassium: 3.4 mEq/L — ABNORMAL LOW (ref 3.5–5.1)
Sodium: 138 mEq/L (ref 135–145)

## 2011-09-29 MED ORDER — IOHEXOL 300 MG/ML  SOLN
100.0000 mL | Freq: Once | INTRAMUSCULAR | Status: AC | PRN
  Administered 2011-09-29: 100 mL via INTRAVENOUS

## 2011-09-29 MED ORDER — OXYCODONE-ACETAMINOPHEN 5-325 MG PO TABS: 1.0000 | ORAL_TABLET | Freq: Four times a day (QID) | ORAL | Status: AC | PRN

## 2011-09-29 NOTE — ED Provider Notes (Signed)
History    This chart was scribed for Maudry Diego, MD, MD by Rhae Lerner. The patient was seen in room APA16A and the patient's care was started at 6:12PM.   CSN: [id number]  Arrival date & time 09/29/11  1737   First MD Initiated Contact with Patient 09/29/11 1809      Chief Complaint  Patient presents with  . Fall  . Back Pain    (Consider location/radiation/quality/duration/timing/severity/associated sxs/prior treatment) Patient is a 41 y.o. male presenting with fall and back pain. The history is provided by the patient.  Fall The accident occurred more than 2 days ago. The fall occurred while standing. He fell from a height of 3 to 5 ft. He landed on concrete. There was no blood loss. The pain is moderate. There was no entrapment after the fall. There was no drug use involved in the accident. There was no alcohol use involved in the accident. Pertinent negatives include no visual change and no loss of consciousness. The symptoms are aggravated by rotation.  Back Pain    Robert Campbell is a 41 y.o. male who presents to the Emergency Department complaining of fall off of back of transportation van 3 days ago. Pt was seen at [facility] with results showing no broken bones. Pt was seen by PCP today and she suggested he come in for more tests. Pt fell 4-5 ft. He reports having moderate back pain. The pain has been constant since onset without radiation. He landed on his back during fall. 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    Past Medical History  Diagnosis Date  . Hypertension   . Anxiety   . Depression     Past Surgical History  Procedure Date  . Abdominal surgery   . Amputee     No family history on file.  History  Substance Use Topics  . Smoking status: Never Smoker   . Smokeless tobacco: Not on file  . Alcohol Use: Yes      Review of Systems  Musculoskeletal: Positive for back pain.    Neurological: Negative for loss of consciousness.  All other systems reviewed and are negative.   10 Systems reviewed and are negative for acute change except as noted in the HPI.  Allergies  Review of patient's allergies indicates no known allergies.  Home Medications  No current outpatient prescriptions on file.  BP 127/62  Pulse 82  Temp(Src) 98.2 F (36.8 C) (Oral)  Resp 18  Ht 4' (1.219 m)  Wt 225 lb (102.059 kg)  BMI 68.66 kg/m2  SpO2 100%  Physical Exam  Nursing note and vitals reviewed. Constitutional: He is oriented to person, place, and time. He appears well-developed.  HENT:  Head: Normocephalic and atraumatic.  Eyes: Conjunctivae and EOM are normal. No scleral icterus.  Neck: Neck supple. No thyromegaly present.  Cardiovascular: Normal rate and regular rhythm.  Exam reveals no gallop and no friction rub.   No murmur heard. Pulmonary/Chest: No stridor. He has no wheezes. He has no rales. He exhibits no tenderness.  Abdominal: He exhibits no distension. There is no tenderness. There is no rebound.  Musculoskeletal: Normal range of motion. He exhibits no edema.       5x3 cm bruise on upper back Bilateral amputation of legs below knees   Lymphadenopathy:    He has no cervical adenopathy.  Neurological: He is oriented to person, place, and  time. Coordination normal.  Skin: No rash noted. No erythema.  Psychiatric: He has a normal mood and affect. His behavior is normal.    ED Course  Procedures (including critical care time) DIAGNOSTIC STUDIES: Oxygen Saturation is 100% on room air, normal by my interpretation.    COORDINATION OF CARE: 6:17PM EDP discusses pt ED treatment course with pt  7:15PM EDP ordered medication: Omnipaque 300 mg/ ml 100 ml  8:30PM EDP Rechecks pt and discusses lab results.Pt is feeling better. Pt is ready for discharge.    Labs Reviewed  BASIC METABOLIC PANEL - Abnormal; Notable for the following:    Potassium 3.4 (*)    BUN 33  (*)    Creatinine, Ser 1.90 (*)    GFR calc non Af Amer 43 (*)    GFR calc Af Amer 50 (*)    All other components within normal limits  CBC - Abnormal; Notable for the following:    RBC 4.17 (*)    Hemoglobin 11.8 (*)    HCT 36.8 (*)    All other components within normal limits  DIFFERENTIAL   Ct Chest W Contrast  09/29/2011  *RADIOLOGY REPORT*  Clinical Data: Fall.  Back pain.  CT CHEST WITH CONTRAST  Technique:  Multidetector CT imaging of the chest was performed following the standard protocol during bolus administration of intravenous contrast.  Contrast: 147mL OMNIPAQUE IOHEXOL 300 MG/ML IJ SOLN  Comparison: None.  Findings: A Stanford type B aortic dissection is present.  The dissection begins just beyond the takeoff of the subclavian artery. The true lumen is felt to be the left sided and more dominant lumen.  It extends into the abdominal aorta probably into the takeoff of the celiac axis.  It extends to the level of the SMA. This is the lower limit of the study.  Aortic caliber in the descending aorta is maximally 3.8 cm. The superior aspect of the dissection, in the heart, appears fenestrated.  No evidence of mediastinal hemorrhage or pericardial effusion.  No pneumothorax and no pleural effusion.   Patchy density at the right lung bases likely atelectasis.  Upper abdomen is benign.  Small stable next to the left adrenal gland.  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  Critical Value/emergent results were called by telephone at the time of interpretation on 09/28/2011  at 1930 hours  to  [facility], who verbally acknowledged these results.  Original Report Authenticated By: Jamas Lav, M.D.     No diagnosis found.  I spoke with dr. Servando Snare and he felt the finding on the ct were old.  He stated that he could follow up with the pt  MDM    The chart was scribed for me under my direct supervision.   I personally performed the history, physical, and medical decision making and all procedures in the evaluation of this patient.Maudry Diego, MD 09/29/11 2034

## 2011-09-29 NOTE — Discharge Instructions (Signed)
Follow up with dr. Servando Snare in 1-2 weeks.  Get a copy of the records from the surgery in [place] and a copy of the cat scans.

## 2011-09-29 NOTE — ED Notes (Signed)
Pt DC to home in the care of [facility].  Pt to go by stretcher due to unavailability of wheelchair van.

## 2011-09-29 NOTE — ED Notes (Signed)
Paged [facility] for [facility]

## 2011-09-30 ENCOUNTER — Ambulatory Visit (HOSPITAL_COMMUNITY): Payer: Medicare Other | Admitting: Physical Therapy

## 2011-10-03 ENCOUNTER — Ambulatory Visit (HOSPITAL_COMMUNITY): Payer: Medicare Other | Admitting: *Deleted

## 2011-10-03 ENCOUNTER — Telehealth (HOSPITAL_COMMUNITY): Payer: Self-pay

## 2011-10-05 ENCOUNTER — Ambulatory Visit (HOSPITAL_COMMUNITY): Payer: Medicare Other | Admitting: Physical Therapy

## 2011-10-07 ENCOUNTER — Ambulatory Visit (HOSPITAL_COMMUNITY): Payer: Medicare Other | Admitting: Physical Therapy

## 2011-10-12 DIAGNOSIS — I10 Essential (primary) hypertension: Secondary | ICD-10-CM | POA: Insufficient documentation

## 2011-10-12 DIAGNOSIS — F419 Anxiety disorder, unspecified: Secondary | ICD-10-CM | POA: Insufficient documentation

## 2011-10-12 DIAGNOSIS — H547 Unspecified visual loss: Secondary | ICD-10-CM | POA: Insufficient documentation

## 2011-10-12 DIAGNOSIS — F32A Depression, unspecified: Secondary | ICD-10-CM | POA: Insufficient documentation

## 2011-10-12 DIAGNOSIS — E785 Hyperlipidemia, unspecified: Secondary | ICD-10-CM | POA: Insufficient documentation

## 2011-10-12 DIAGNOSIS — I739 Peripheral vascular disease, unspecified: Secondary | ICD-10-CM | POA: Insufficient documentation

## 2011-10-12 DIAGNOSIS — Z8673 Personal history of transient ischemic attack (TIA), and cerebral infarction without residual deficits: Secondary | ICD-10-CM | POA: Insufficient documentation

## 2011-10-12 DIAGNOSIS — F329 Major depressive disorder, single episode, unspecified: Secondary | ICD-10-CM | POA: Insufficient documentation

## 2011-10-12 DIAGNOSIS — I7100 Dissection of unspecified site of aorta: Secondary | ICD-10-CM | POA: Insufficient documentation

## 2011-10-12 DIAGNOSIS — Z89619 Acquired absence of unspecified leg above knee: Secondary | ICD-10-CM | POA: Insufficient documentation

## 2011-10-13 ENCOUNTER — Institutional Professional Consult (permissible substitution) (INDEPENDENT_AMBULATORY_CARE_PROVIDER_SITE_OTHER): Payer: Medicare Other | Admitting: Thoracic Surgery (Cardiothoracic Vascular Surgery)

## 2011-10-13 ENCOUNTER — Encounter: Payer: Self-pay | Admitting: Thoracic Surgery (Cardiothoracic Vascular Surgery)

## 2011-10-13 VITALS — BP 146/85 | HR 74 | Resp 18 | Ht 67.0 in | Wt 225.0 lb

## 2011-10-13 DIAGNOSIS — I7101 Dissection of thoracic aorta: Secondary | ICD-10-CM
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
## 2011-10-14 ENCOUNTER — Ambulatory Visit (HOSPITAL_COMMUNITY): Payer: Medicare Other

## 2011-10-17 ENCOUNTER — Ambulatory Visit (HOSPITAL_COMMUNITY): Payer: Medicare Other | Admitting: *Deleted

## 2011-10-19 ENCOUNTER — Ambulatory Visit (HOSPITAL_COMMUNITY)
Admission: RE | Admit: 2011-10-19 | Payer: Medicare Other | Source: Ambulatory Visit | Attending: Family Medicine | Admitting: Family Medicine

## 2011-10-21 ENCOUNTER — Ambulatory Visit (HOSPITAL_COMMUNITY)
Admission: RE | Admit: 2011-10-21 | Discharge: 2011-10-21 | Disposition: A | Discharge status: Home / Self Care | Payer: Medicare Other | Source: Ambulatory Visit | Attending: Family Medicine | Admitting: Family Medicine

## 2011-10-21 DIAGNOSIS — M6281 Muscle weakness (generalized): Secondary | ICD-10-CM | POA: Insufficient documentation

## 2011-10-21 DIAGNOSIS — R269 Unspecified abnormalities of gait and mobility: Secondary | ICD-10-CM | POA: Insufficient documentation

## 2011-10-21 DIAGNOSIS — M2560 Stiffness of unspecified joint, not elsewhere classified: Secondary | ICD-10-CM

## 2011-10-21 DIAGNOSIS — R6889 Other general symptoms and signs: Secondary | ICD-10-CM

## 2011-10-21 DIAGNOSIS — S78119A Complete traumatic amputation at level between unspecified hip and knee, initial encounter: Secondary | ICD-10-CM | POA: Insufficient documentation

## 2011-10-21 DIAGNOSIS — R29898 Other symptoms and signs involving the musculoskeletal system: Secondary | ICD-10-CM

## 2011-10-21 DIAGNOSIS — Z5189 Encounter for other specified aftercare: Secondary | ICD-10-CM | POA: Insufficient documentation

## 2011-10-21 DIAGNOSIS — M24559 Contracture, unspecified hip: Secondary | ICD-10-CM
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
## 2011-10-24 ENCOUNTER — Ambulatory Visit (HOSPITAL_COMMUNITY)
Admission: RE | Admit: 2011-10-24 | Discharge: 2011-10-24 | Disposition: A | Discharge status: Home / Self Care | Payer: Medicare Other | Source: Ambulatory Visit | Attending: Family Medicine | Admitting: Family Medicine

## 2011-10-24 NOTE — Progress Notes (Signed)
Physical Therapy Treatment Patient Details  Name: Robert Campbell MRN: [id number] Date of Birth: [birth year]  Today's Date: 10/24/2011 Time: [id number] Time Calculation (min): 67 min Visit#: 47  of 58   Re-eval: 11/20/11    Subjective:  Pt does not believe he is ready to start walking again.  Emphasized the importance of weight-bearing as soon as possible.  PT states he is in no pain just feels he is too weak.  Explained that weightbearing is the best way to become stronger again.    Precautions/Restrictions   falls  Exercise/Treatments Mobility/Balance  Transfers Sit to Stand: 4: Min assist Stand to Sit: 4: Min assist Ambulation/Gait Ambulation/Gait Assistance: 4: Min assist Ambulation/Gait Assistance Details (indicate cue type and reason): Pt able to stand 4 times with max time of 2'. Ambulation Distance (Feet):  (Pt took 2 steps fwd/back x 3) Assistive device: Parallel bars Gait Pattern: Step-to pattern      Hip Exercises  Completed Prone AAROM for ext B x 10; SL hip flex-ext; AB x 5 B with L LE 8# for AB.  Supine PROM for hip flex with therapist pushing LE to mat x 30sec. X 3 B.       Physical Therapy Assessment and Plan PT Assessment and Plan Clinical Impression Statement: Though pt's endurance has decreased pt tolerates over session well. Pt able to stand for 2'. Pt able to take multiple steps with min assist. Pt required some motivation to begin standing but after pt stood once he became motivated to do more and more. PT Plan: Be aggressive with walking; encourage patient to begin normal home exercises agarin .    Goals    Problem List Patient Active Problem List  Diagnoses  . Stiffness of joint, not elsewhere classified, multiple sites  . Weakness of both legs  . Contracture of hip joint  . Other general symptoms  . Blindness  . Depression  . Anxiety  . Hypertension  . H/O above knee amputation  . PVD (peripheral vascular disease)  . Hyperlipidemia  .  History of stroke  . Aortic dissection    PT - End of Session Activity Tolerance: Patient tolerated treatment well General Behavior During Session: [facility] for tasks performed Cognition: [facility] for tasks performed PT Plan of Care Consulted and Agree with Plan of Care: Patient  GP will need at 51 No functional reporting required Rachelle Hora PTA completed GT activities RUSSELL,[name] PT completed ex 10/24/2011, 1:12 PM

## 2011-10-26 ENCOUNTER — Ambulatory Visit (HOSPITAL_COMMUNITY)
Admission: RE | Admit: 2011-10-26 | Discharge: 2011-10-26 | Disposition: A | Discharge status: Home / Self Care | Payer: Medicare Other | Source: Ambulatory Visit | Attending: Family Medicine | Admitting: Family Medicine

## 2011-10-26 DIAGNOSIS — M24559 Contracture, unspecified hip: Secondary | ICD-10-CM

## 2011-10-26 DIAGNOSIS — R29898 Other symptoms and signs involving the musculoskeletal system: Secondary | ICD-10-CM

## 2011-10-26 DIAGNOSIS — R6889 Other general symptoms and signs: Secondary | ICD-10-CM

## 2011-10-26 DIAGNOSIS — M2560 Stiffness of unspecified joint, not elsewhere classified: Secondary | ICD-10-CM

## 2011-10-26 NOTE — Progress Notes (Signed)
Physical Therapy Treatment Patient Details  Name: Robert Campbell MRN: [id number] Date of Birth: 1971-03-07  Today's Date: 10/26/2011 Time: 1017-1100 Time Calculation (min): 43 min Visit#: 48  of 58   Re-eval: 11/20/11  Charge:  Gait 23 min Manual 12 min  Subjective: Symptoms/Limitations Symptoms: Pt stated pain free today reported compliance with HEP.  Objective:  Exercise/Treatments Mobility/Balance  Bed Mobility Bed Mobility: No Transfers Transfers: Yes Sit to Stand: 5: Supervision Stand to Sit: 5: Supervision Ambulation/Gait Ambulation/Gait: Yes Ambulation/Gait Assistance: 4: Min assist Ambulation Distance (Feet):  (4 steps fwd/back x 4 sets) Assistive device:  (parallel bare) Gait Pattern: Step-through pattern;Decreased stride length     Standing Gait Training: gait training within // bars; R/L weight shifting; A/P weight shifting; ambulating forwards and backwards x 23 min.  Pt able to stand independently (with HHA) x 2 min x 3 reps Seated Other Seated Knee Exercises: Hip abduction stretch with brace 3x 30" Supine Other Supine Knee Exercises: Passive hip flex stretch,(leg to mat) 30 sec x 3 Prone  Other Prone Exercises: PROM for hip ext      Physical Therapy Assessment and Plan PT Assessment and Plan Clinical Impression Statement: Pt with increased flexibility with todays session, able to fit in the abduction brace, pt was unable to wear brace last session.  Pt with increased independence with gait today, pt able to stand independently with supervision; able to take multiple steps with min assistance.  Pt given abduction brace to add to stretches at home. PT Plan: Continue aggression with ambulation; continue stretching and strengthening.      Goals    Problem List Patient Active Problem List  Diagnoses  . Stiffness of joint, not elsewhere classified, multiple sites  . Weakness of both legs  . Contracture of hip joint  . Other general symptoms  .  Blindness  . Depression  . Anxiety  . Hypertension  . H/O above knee amputation  . PVD (peripheral vascular disease)  . Hyperlipidemia  . History of stroke  . Aortic dissection    PT - End of Session Activity Tolerance: Patient tolerated treatment well General Behavior During Session: [facility] for tasks performed Cognition: [facility] for tasks performed  GP No functional reporting required  Aldona Lento 10/26/2011, 12:08 PM

## 2011-10-28 ENCOUNTER — Ambulatory Visit (HOSPITAL_COMMUNITY)
Admission: RE | Admit: 2011-10-28 | Discharge: 2011-10-28 | Disposition: A | Discharge status: Home / Self Care | Payer: Medicare Other | Source: Ambulatory Visit | Attending: Family Medicine | Admitting: Family Medicine
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
## 2011-11-02 ENCOUNTER — Ambulatory Visit (HOSPITAL_COMMUNITY)
Admission: RE | Admit: 2011-11-02 | Discharge: 2011-11-02 | Disposition: A | Discharge status: Home / Self Care | Payer: Medicare Other | Source: Ambulatory Visit | Attending: Family Medicine | Admitting: Family Medicine

## 2011-11-02 NOTE — Progress Notes (Signed)
Physical Therapy Treatment Patient Details  Name: Robert Campbell MRN: [id number] Date of Birth: 1970/09/15  Today's Date: 11/02/2011 Time: [id number] Time Calculation (min): 65 min Visit#: 50  of 59   Re-eval: 11/18/11 Charges: Therex x 28' Self care x 10'  Subjective: Symptoms/Limitations Symptoms: Pt states that caregiver noticed wound on L posterior proximal thrigh. Pain Assessment Currently in Pain?: No/denies   Exercise/Treatments Seated Other Seated Knee Exercises: AROM hip abduction 2 x 10 Supine Straight Leg Raises: AAROM;Strengthening;Both;10 reps;Limitations Other Supine Knee Exercises: PROM for hip flex stretch. Sidelying Hip ABduction: Strengthening;Both;10 reps;Limitations Hip ABduction Limitations: L 8# Hip ADduction: Both;10 reps Prone  Hip Extension: AAROM;Strengthening;Both;10 reps   Physical Therapy Assessment and Plan PT Assessment and Plan Clinical Impression Statement: Pt made therapist aware of wound on L posterior proximal medial thigh. Wound measures .8x1.6 cm. Wound is 100% granulated and appears to be a skin tear. Wound was dressed by home nurse. After consulting Geoffery Lyons, PT wound was dressed with hydrogel and 2x2, anchored with medipore tape. Attempted to notify MD. Left message with secretary for MD to return call. Pt educated on importance of checking skin integrity regularly. Pt also advised to take liners off at least one hour a day, as pt reports he wears them all day long and takes them off only at night. Weight bearing exercises held this session secondary to wound. Pt completes mat exercises well. Though it has improved, pt continues to have difficulty with disassociation of LE's with therex. Pt reports no increase in pain at end of session. PT Plan: Continue to progress per PT POC. Check MD's orders for wound next session.     Problem List Patient Active Problem List  Diagnoses  . Stiffness of joint, not elsewhere classified, multiple  sites  . Weakness of both legs  . Contracture of hip joint  . Other general symptoms  . Blindness  . Depression  . Anxiety  . Hypertension  . H/O above knee amputation  . PVD (peripheral vascular disease)  . Hyperlipidemia  . History of stroke  . Aortic dissection    PT - End of Session Activity Tolerance: Patient tolerated treatment well General Behavior During Session: [facility] for tasks performed Cognition: [facility] for tasks performed   Rachelle Hora, PTA 11/02/2011, 11:58 AM

## 2011-11-03 ENCOUNTER — Ambulatory Visit (HOSPITAL_COMMUNITY): Payer: Self-pay | Admitting: *Deleted

## 2011-11-04 ENCOUNTER — Ambulatory Visit (HOSPITAL_COMMUNITY)
Admission: RE | Admit: 2011-11-04 | Discharge: 2011-11-04 | Disposition: A | Discharge status: Home / Self Care | Payer: Medicare Other | Source: Ambulatory Visit | Attending: Family Medicine | Admitting: Family Medicine

## 2011-11-04 DIAGNOSIS — M2560 Stiffness of unspecified joint, not elsewhere classified: Secondary | ICD-10-CM

## 2011-11-04 DIAGNOSIS — R29898 Other symptoms and signs involving the musculoskeletal system: Secondary | ICD-10-CM

## 2011-11-04 DIAGNOSIS — M24559 Contracture, unspecified hip: Secondary | ICD-10-CM

## 2011-11-04 DIAGNOSIS — R6889 Other general symptoms and signs: Secondary | ICD-10-CM

## 2011-11-04 NOTE — Progress Notes (Signed)
Physical Therapy Treatment Patient Details  Name: Robert Campbell MRN: [id number] Date of Birth: 10-18-1970  Today's Date: 11/04/2011 Time: [id number] Time Calculation (min): 50 min Visit#: 51  of 60   Re-eval: 11/18/11    Subjective: Symptoms/Limitations Symptoms: Pt still does not have order for wound but states his grandmother is changing the dressing and is stating that his wound is improving.    Exercise/Treatments   Supine Straight Leg Raises: AAROM;Strengthening;Both;10 reps;Limitations Other Supine Knee Exercises: PROM for hip flex stretch. Other Supine Knee Exercises: PROM for hip abduction  Sidelying Hip ABduction: Strengthening;Both;10 reps;Limitations Hip ABduction Limitations: L 8# Hip ADduction: Both;10 reps Hip ADduction Limitations: R ADD 8# Other Sidelying Knee Exercises: flex/ext B x 10 Prone  Hip Extension: AAROM;Strengthening;Both;10 reps    Physical Therapy Assessment and Plan PT Assessment and Plan Clinical Impression Statement: Will assess wound if we have an order next treatment.  Will hold off on gait training until wound heals.    Goals    Problem List Patient Active Problem List  Diagnoses  . Stiffness of joint, not elsewhere classified, multiple sites  . Weakness of both legs  . Contracture of hip joint  . Other general symptoms  . Blindness  . Depression  . Anxiety  . Hypertension  . H/O above knee amputation  . PVD (peripheral vascular disease)  . Hyperlipidemia  . History of stroke  . Aortic dissection    PT - End of Session Activity Tolerance: Patient tolerated treatment well General Behavior During Session: [facility] for tasks performed Cognition: [facility] for tasks performed  GP No functional reporting required  Jerricka Carvey,[name] 11/04/2011, 12:43 PM

## 2011-11-07 ENCOUNTER — Ambulatory Visit (HOSPITAL_COMMUNITY)
Admission: RE | Admit: 2011-11-07 | Discharge: 2011-11-07 | Disposition: A | Discharge status: Home / Self Care | Payer: Medicare Other | Source: Ambulatory Visit | Attending: Family Medicine | Admitting: Family Medicine

## 2011-11-07 NOTE — Progress Notes (Addendum)
Physical Therapy Treatment Patient Details  Name: Robert Campbell MRN: [id number] Date of Birth: [birth year]  Today's Date: 11/07/2011 Time: [id number] Time Calculation (min): 52 min Visit#: 52  of 60   Re-eval: 11/18/11 Charges: Therex x 30'  Subjective: Symptoms/Limitations Symptoms: Pt states family is still caring for wound at home.  Pain Assessment Currently in Pain?: No/denies  Blood pressue taken per pt request: 150/88  (Pt states he is supposed to begin blood pressure medication soon.)  Exercise/Treatments Supine Straight Leg Raises: 10 reps;AAROM;Both Other Supine Knee Exercises: PROM for hip flex stretch. Sidelying Hip ABduction: 15 reps;Both Hip ABduction Limitations: L 8# Prone  Hip Extension: AAROM;Strengthening;Both;10 reps  Attempted prone hip ext stretch with LE on wedge. Pt no longer benefits from the stretch as he cannot feel it.  Physical Therapy Assessment and Plan PT Assessment and Plan Clinical Impression Statement: Wound ordered received. PT assessed wound area. Wound is healed. Will continue to hold standing exercises as skin integrity remains compromised where wound was. Pt displays improved strength with mat exercises. PT Plan: Continue to progress per PT POC. Continue to hold standing activities until next week, per PT.     Problem List Patient Active Problem List  Diagnoses  . Stiffness of joint, not elsewhere classified, multiple sites  . Weakness of both legs  . Contracture of hip joint  . Other general symptoms  . Blindness  . Depression  . Anxiety  . Hypertension  . H/O above knee amputation  . PVD (peripheral vascular disease)  . Hyperlipidemia  . History of stroke  . Aortic dissection    PT - End of Session Activity Tolerance: Patient tolerated treatment well General Behavior During Session: [facility] for tasks performed Cognition: [facility] for tasks performed   Rachelle Hora, PTA 11/07/2011, 12:55 PM

## 2011-11-09 ENCOUNTER — Ambulatory Visit (HOSPITAL_COMMUNITY)
Admission: RE | Admit: 2011-11-09 | Discharge: 2011-11-09 | Disposition: A | Discharge status: Home / Self Care | Payer: Medicare Other | Source: Ambulatory Visit | Attending: Family Medicine | Admitting: Family Medicine

## 2011-11-09 NOTE — Progress Notes (Signed)
Physical Therapy Treatment Patient Details  Name: Hezakiah Paladino MRN: [id number] Date of Birth: [birth year]  Today's Date: 11/09/2011 Time: [id number] Time Calculation (min): 42 min Visit#: 53  of 60   Re-eval: 11/18/11 Charges: Therex x 39'  Subjective: Symptoms/Limitations Symptoms: "That was a great work out." (At end of session) Pain Assessment Currently in Pain?: No/denies   Exercise/Treatments Seated Other Seated Knee Exercises: Reaching forward back to back with therapist to increase hip flexion Supine Straight Leg Raises: 10 reps;AAROM;Both Other Supine Knee Exercises: PROM for hip flex stretch. Other Supine Knee Exercises: PROM for hip abduction  Sidelying Hip ABduction: 15 reps;Both Hip ABduction Limitations: L 8# Hip ADduction: Both;10 reps Prone  Hip Extension: AAROM;Strengthening;Both;10 reps  Physical Therapy Assessment and Plan PT Assessment and Plan Clinical Impression Statement: Pt completes exercises very well this session. Pt with improved mm control with exercises. Pt completes SLR with improved disassociation. Pt is without complain throughout session. Pt reports 0/10 pain at end of session. PT Plan: Continue to progress per PT POC. Continue to hold standing activities until next week, per PT.     Problem List Patient Active Problem List  Diagnoses  . Stiffness of joint, not elsewhere classified, multiple sites  . Weakness of both legs  . Contracture of hip joint  . Other general symptoms  . Blindness  . Depression  . Anxiety  . Hypertension  . H/O above knee amputation  . PVD (peripheral vascular disease)  . Hyperlipidemia  . History of stroke  . Aortic dissection    PT - End of Session Activity Tolerance: Patient tolerated treatment well General Behavior During Session: [facility] for tasks performed Cognition: [facility] for tasks performed    Rachelle Hora, PTA 11/09/2011, 10:51 AM

## 2011-11-11 ENCOUNTER — Ambulatory Visit (HOSPITAL_COMMUNITY)
Admission: RE | Admit: 2011-11-11 | Discharge: 2011-11-11 | Disposition: A | Discharge status: Home / Self Care | Payer: Medicare Other | Source: Ambulatory Visit | Attending: Family Medicine | Admitting: Family Medicine

## 2011-11-11 DIAGNOSIS — R6889 Other general symptoms and signs: Secondary | ICD-10-CM

## 2011-11-11 DIAGNOSIS — M24559 Contracture, unspecified hip: Secondary | ICD-10-CM

## 2011-11-11 DIAGNOSIS — M2560 Stiffness of unspecified joint, not elsewhere classified: Secondary | ICD-10-CM

## 2011-11-11 DIAGNOSIS — R29898 Other symptoms and signs involving the musculoskeletal system: Secondary | ICD-10-CM

## 2011-11-11 NOTE — Progress Notes (Signed)
Physical Therapy Treatment Patient Details  Name: Robert Campbell MRN: [id number] Date of Birth: Jan 03, 1971  Today's Date: 11/11/2011 Time: [id number] Time Calculation (min): 58 min Visit#: 54  of 60   Re-eval: 11/18/11    Subjective: Symptoms/Limitations Symptoms: Pt has no complaints. Pain Assessment Currently in Pain?: No/denies   Exercise/Treatments   Seated Other Seated Knee Exercises: Reaching forward back to back with therapist to increase hip flexion Supine Straight Leg Raises: 10 reps;AAROM;Both Other Supine Knee Exercises: PROM for hip flex stretch. Other Supine Knee Exercises: PROM for hip abduction  Sidelying Hip ABduction: 15 reps;Both Hip ABduction Limitations: L 10# Hip ADduction: Both;10 reps Prone  Hip Extension: AAROM;Strengthening;Both;10 reps    Physical Therapy Assessment and Plan PT Assessment and Plan Clinical Impression Statement: Pt did well with exercises will begin gait training next treatment. Clinical Impairments Affecting Rehab Potential: contractures, immobility, weakness. PT Frequency: Min 3X/week PT Plan: begin gait activities Monday.    Goals    Problem List Patient Active Problem List  Diagnoses  . Stiffness of joint, not elsewhere classified, multiple sites  . Weakness of both legs  . Contracture of hip joint  . Other general symptoms  . Blindness  . Depression  . Anxiety  . Hypertension  . H/O above knee amputation  . PVD (peripheral vascular disease)  . Hyperlipidemia  . History of stroke  . Aortic dissection    PT - End of Session Activity Tolerance: Patient tolerated treatment well General Behavior During Session: [facility] for tasks performed Cognition: [facility] for tasks performed  GP No functional reporting required  Robert Campbell,Robert Campbell 11/11/2011, 12:05 PM

## 2011-11-11 NOTE — Evaluation (Deleted)
Physical Therapy Evaluation  Patient Details  Name: Robert Campbell MRN: [id number] Date of Birth: 10-06-1970  Today's Date: 11/11/2011 Time: [id number] Time Calculation (min): 58 min  Visit#: 52  of 34   Re-eval: 11/18/11    Past Medical History:  Past Medical History  Diagnosis Date  . Hypertension   . Anxiety   . Depression   . Blindness     right eye, low vision left eye  . H/O above knee amputation   . PVD (peripheral vascular disease)   . Hyperlipidemia   . History of stroke   . 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 Abdominal surgery   . Amputee     Subjective Symptoms/Limitations Symptoms: Pt has no complaints. Pain Assessment Currently in Pain?: No/denies     Exercise/Treatments   Seated Other Seated Knee Exercises: Reaching forward back to back with therapist to increase hip flexion Supine Straight Leg Raises: 10 reps;AAROM;Both Other Supine Knee Exercises: PROM for hip flex stretch. Other Supine Knee Exercises: PROM for hip abduction  Sidelying Hip ABduction: 15 reps;Both Hip ABduction Limitations: L 10# Hip ADduction: Both;10 reps Prone  Hip Extension: AAROM;Strengthening;Both;10 reps   Physical Therapy Assessment and Plan PT Assessment and Plan Clinical Impression Statement: Pt did well with exercises will begin gait training next treatment. Clinical Impairments Affecting Rehab Potential: contractures, immobility, weakness. PT Frequency: Min 3X/week PT Plan: begin gait activities Monday.    Goals    Problem List Patient Active Problem List  Diagnoses  . Stiffness of joint, not elsewhere classified, multiple sites  . Weakness of both legs  . Contracture of hip joint  . Other general symptoms  . Blindness  . Depression  . Anxiety  . Hypertension  . H/O above knee amputation  . PVD (peripheral vascular disease)  .  Hyperlipidemia  . History of stroke  . Aortic dissection    PT - End of Session Activity Tolerance: Patient tolerated treatment well General Behavior During Session: [facility] for tasks performed Cognition: [facility] for tasks performed  GP  Functional Reporting Modifier  Tarisha Fader,[name] 11/11/2011, 12:02 PM  Physician Documentation Your signature is required to indicate approval of the treatment plan as stated above.  Please sign and either send electronically or make a copy of this report for your files and return this physician signed original.   Please mark one 1.__approve of plan  2. ___approve of plan with the following conditions.   ______________________________                                                          _____________________ Physician Signature  Date  

## 2011-11-14 ENCOUNTER — Ambulatory Visit (HOSPITAL_COMMUNITY)
Admission: RE | Admit: 2011-11-14 | Discharge: 2011-11-14 | Disposition: A | Discharge status: Home / Self Care | Payer: Medicare Other | Source: Ambulatory Visit | Attending: Family Medicine | Admitting: Family Medicine

## 2011-11-14 NOTE — Progress Notes (Signed)
Physical Therapy Treatment Patient Details  Name: Robert Campbell MRN: [id number] Date of Birth: Nov 14, 1970  Today's Date: 11/14/2011 Time: 1000-1100 Time Calculation (min): 60 min Visit#: 55  of 60   Re-eval: 11/18/11 Charges: Therex x 18' Gait x 20'  Subjective: Symptoms/Limitations Symptoms: I'm still stretching at home. Pain Assessment Currently in Pain?: No/denies   Exercise/Treatments Mobility/Balance  Transfers Transfers: Sit to Stand;Stand to Sit Sit to Stand: 4: Min guard Stand to Sit: 4: Min guard Ambulation/Gait Ambulation/Gait Assistance: 4: Min assist Ambulation/Gait Assistance Details: Pt able to stand 4 times and complete gait length of parallel bars x 4 Assistive device: Parallel bars Gait Pattern: Step-through pattern;Decreased stride length;Trunk flexed  Seated Other Seated Knee Exercises: Isometric add 10x5" with soccer ball; abd 10x5" with manual resistance Other Seated Knee Exercises: PROM hip abd 4x30"  Physical Therapy Assessment and Plan PT Assessment and Plan Clinical Impression Statement: Began gait training again this session. Pt able to stand four times. Pt able to walk length of parallel bars 5 times. Pt requires contact guard to stand. Pt is without complaint throughout session. Pt reports 0/10 pain at end of session. PT Plan: Continue to progress per PT POC.     Problem List Patient Active Problem List  Diagnoses  . Stiffness of joint, not elsewhere classified, multiple sites  . Weakness of both legs  . Contracture of hip joint  . Other general symptoms  . Blindness  . Depression  . Anxiety  . Hypertension  . H/O above knee amputation  . PVD (peripheral vascular disease)  . Hyperlipidemia  . History of stroke  . Aortic dissection    PT - End of Session Activity Tolerance: Patient tolerated treatment well General Behavior During Session: [facility] for tasks performed Cognition: [facility] for tasks performed   Rachelle Hora,  PTA 11/14/2011, 11:27 AM

## 2011-11-16 ENCOUNTER — Ambulatory Visit (HOSPITAL_COMMUNITY)
Admission: RE | Admit: 2011-11-16 | Discharge: 2011-11-16 | Disposition: A | Discharge status: Home / Self Care | Payer: Medicare Other | Source: Ambulatory Visit | Attending: Family Medicine | Admitting: Family Medicine

## 2011-11-16 DIAGNOSIS — R269 Unspecified abnormalities of gait and mobility: Secondary | ICD-10-CM | POA: Insufficient documentation

## 2011-11-16 DIAGNOSIS — S78119A Complete traumatic amputation at level between unspecified hip and knee, initial encounter: Secondary | ICD-10-CM | POA: Insufficient documentation

## 2011-11-16 DIAGNOSIS — Z5189 Encounter for other specified aftercare: Secondary | ICD-10-CM | POA: Insufficient documentation

## 2011-11-16 DIAGNOSIS — M6281 Muscle weakness (generalized): Secondary | ICD-10-CM | POA: Insufficient documentation

## 2011-11-16 NOTE — Progress Notes (Signed)
Physical Therapy Treatment Patient Details  Name: Robert Campbell MRN: [id number] Date of Birth: 1971-06-17  Today's Date: 11/16/2011 Time: [id number] PT Time Calculation (min): 54 min Visit#: 56  of 60   Re-eval: 11/18/11 Charges: Prosthetic training x 8' Therex x 15' Gait x 20'   Subjective: Symptoms/Limitations Symptoms: My legs felt like they fit good last session. Pain Assessment Currently in Pain?: No/denies   Exercise/Treatments Mobility/Balance  Transfers Transfers: Sit to Stand;Stand to Sit Sit to Stand: 4: Min guard Sit to Stand Details (indicate cue type and reason): Pt stood 6 times Stand to Sit: 4: Min guard Ambulation/Gait Ambulation/Gait Assistance: 4: Min guard Ambulation/Gait Assistance Details: Pt able to stand 6 times and complete gait length of parallel bars x 6 1/2 Ambulation Distance (Feet):  (6 1/2 RT) Assistive device: Parallel bars Gait Pattern: Step-through pattern;Decreased stride length;Trunk flexed   Seated Other Seated Knee Exercises: Isometric add 10x5" with soccer ball; abd 10x5" with manual resistance Other Seated Knee Exercises: PROM hip abd 4x30"  Physical Therapy Assessment and Plan PT Assessment and Plan Clinical Impression Statement: Pt presents with improved activity tolerance this session. Pt able to stand and walk 6 times. Therapist recorded video of pt walking on his phone so that he can show family members. This seemed to motivate pt to do more. Pt presents with increased step length this session. Pt requires vc's to avoid adduction with gait. Pt reports 0/10 pain at end of session. PT Plan: Reassess next session.      Problem List Patient Active Problem List  Diagnoses  . Stiffness of joint, not elsewhere classified, multiple sites  . Weakness of both legs  . Contracture of hip joint  . Other general symptoms  . Blindness  . Depression  . Anxiety  . Hypertension  . H/O above knee amputation  . PVD (peripheral vascular  disease)  . Hyperlipidemia  . History of stroke  . Aortic dissection    PT - End of Session Activity Tolerance: Patient tolerated treatment well General Behavior During Session: [facility] for tasks performed Cognition: [facility] for tasks performed   Rachelle Hora, PTA 11/16/2011, 10:41 AM

## 2011-11-18 ENCOUNTER — Ambulatory Visit (HOSPITAL_COMMUNITY)
Admission: RE | Admit: 2011-11-18 | Discharge: 2011-11-18 | Disposition: A | Discharge status: Home / Self Care | Payer: Medicare Other | Source: Ambulatory Visit | Attending: Family Medicine | Admitting: Family Medicine

## 2011-11-18 DIAGNOSIS — M24559 Contracture, unspecified hip: Secondary | ICD-10-CM

## 2011-11-18 DIAGNOSIS — R6889 Other general symptoms and signs: Secondary | ICD-10-CM

## 2011-11-18 DIAGNOSIS — M2560 Stiffness of unspecified joint, not elsewhere classified: Secondary | ICD-10-CM

## 2011-11-18 DIAGNOSIS — R29898 Other symptoms and signs involving the musculoskeletal system: Secondary | ICD-10-CM

## 2011-11-18 NOTE — Evaluation (Signed)
Physical Therapy Evaluation  Patient Details  Name: Robert Campbell MRN: [id number] Date of Birth: [birth year]  Today's Date: 11/18/2011 Time: [id number] PT Time Calculation (min): 53 min  Visit#: 58  of 59   Re-eval: 12/18/11 Assessment Diagnosis: B AKA Surgical Date: 09/17/07  Past Medical History:  Past Medical History  Diagnosis Date  . Hypertension   . Anxiety   . Depression   . Blindness     right eye, low vision left eye  . H/O above knee amputation   . PVD (peripheral vascular disease)   . Hyperlipidemia   . History of stroke   . 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 Abdominal surgery   . Amputee     Subjective Symptoms/Limitations Symptoms: I'm doing my exercises at home How long can you stand comfortably?: Able to stand for 6 minutes was 4 minutes How long can you walk comfortably?: Ambulating at // for a total of 25 feet  Pain Assessment Currently in Pain?: No/denies     Assessment RLE AROM (degrees) Right Hip Extension 0-30:  (-18 was -18 PROM 2 was 0) Right Hip Flexion 0-125:  (rests in 10 degree of flex; Active to 10 Passive to 58) Right Hip ABduction 0-45:  (rests at -12 active to -9 passive to 0; Was active to -12 pa) Right Hip ADduction 0-25:  (rests at -12) RLE Strength Right Hip Flexion:  (1+ was 1+) Right Hip Extension: 3-/5 Right Hip ABduction: 2-/5 (was1+) Right Hip ADduction: 3+/5 (was 2) LLE AROM (degrees) Left Hip Extension 0-30:  (AROM -17 was -20; PROM --5 was -10) Left Hip Flexion 0-125:  (Active 33 Passive to 65 was active 30 passive to 60) Left Hip ABduction 0-45:  (rests at -10; active to -5 passive to 0; was) Left Hip ADduction 0-25:  (rest in -10 was resting in -10) LLE Strength Left Hip Flexion:  (2- was 1+/5) Left Hip Extension: 2-/5 (was 1+) Left Hip ABduction: 3-/5 (was a 2/5) Left Hip ADduction: 3+/5 (in  available range was 2+)  Exercise/Treatments Mobility/Balance-    Able to walk 10 feet in // with supervision. Seated Other Seated Knee Exercises: PROM for all    Physical Therapy Assessment and Plan PT Assessment and Plan Clinical Impression Statement: Pt shows gains in almost all aspects during this reevaluation session.  Pt is now ready to begin sit to stand form wheelchair and using walker to progress pt into functional amubulation.  Pt will need continued skilled therapy to promote I  with  ambulation,. Pt will benefit from skilled therapeutic intervention in order to improve on the following deficits: Decreased strength;Decreased range of motion;Decreased activity tolerance;Decreased balance Rehab Potential: Good PT Frequency: Min 3X/week PT Duration: 4 weeks PT Treatment/Interventions: Gait training;DME instruction;Functional mobility training;Therapeutic activities;Therapeutic exercise PT Plan: Continue with progression of pt. as above.    Goals Home Exercise Program Pt will Perform Home Exercise Program: Independently PT Goal: Perform Home Exercise Program - Progress: Met PT Short Term Goals PT Short Term Goal 1: improved for the majority of motions PT Short Term Goal 1 - Progress: Progressing toward goal PT Short Term Goal 2: met for majority of mm PT Short Term Goal 2 - Progress: Progressing toward goal PT Short Term Goal 4 - Progress: Met PT Short Term Goal 5: new goal as of 11/18/2011:  Pt to come sit to  stand from wheelchair with mod assist. Additional PT Short Term Goals?: Yes PT Short Term Goal 6: Pt to be able to ambulate with rolling walker with mod assist x 5 feet. new goal as of 11/18/11 PT Short Term Goal 7: mm strength increased by 1/2 grade.  new goal as of 11/18/2011  Problem List Patient Active Problem List  Diagnoses  . Stiffness of joint, not elsewhere classified, multiple sites  . Weakness of both legs  . Contracture of hip joint  . Other general symptoms   . Blindness  . Depression  . Anxiety  . Hypertension  . H/O above knee amputation  . PVD (peripheral vascular disease)  . Hyperlipidemia  . History of stroke  . Aortic dissection    PT - End of Session Activity Tolerance: Patient tolerated treatment well General Behavior During Session: [facility] for tasks performed Cognition: [facility] for tasks performed PT Plan of Care Consulted and Agree with Plan of Care: Patient   Robert Campbell,Robert Campbell 11/18/2011, 12:22 PM  Physician Documentation Your signature is required to indicate approval of the treatment plan as stated above.  Please sign and either send electronically or make a copy of this report for your files and return this physician signed original.   Please mark one 1.__approve of plan  2. ___approve of plan with the following conditions.   ______________________________                                                          _____________________ Physician Signature                                                                                                             Date

## 2011-11-21 ENCOUNTER — Ambulatory Visit (HOSPITAL_COMMUNITY)
Admission: RE | Admit: 2011-11-21 | Discharge: 2011-11-21 | Disposition: A | Discharge status: Home / Self Care | Payer: Medicare Other | Source: Ambulatory Visit | Attending: Physical Therapy | Admitting: Physical Therapy

## 2011-11-21 NOTE — Progress Notes (Signed)
Physical Therapy Treatment Patient Details  Name: Robert Campbell MRN: [id number] Date of Birth: 01/11/1971  Today's Date: 11/21/2011 Time: 1010-1100 PT Time Calculation (min): 50 min Visit#: 58  of 51   Re-eval: 12/18/11 Charges: Gait x 38'  Subjective: Symptoms/Limitations Symptoms: Once I'm ready to practice with my walker at home I have a long hallway I can use.  Pain Assessment Currently in Pain?: No/denies   Exercise/Treatments Mobility/Balance  Transfers Transfers: Sit to Stand;Stand to Sit Sit to Stand: 4: Min guard;4: Min assist;3: Mod assist (5 x in parallel bars; 4x from WC to walker) Sit to Stand Details (indicate cue type and reason): Pt able to stand in parallel bars with min guard; Pt requires mod-min assist with sit to stand from [facility] to walker Stand to Sit: 4: Min guard;4: Min assist Stand to Sit Details: Min guard in parallel bars; min assist with walker to [facility] Ambulation/Gait Ambulation/Gait: Yes Ambulation/Gait Assistance Details: 4x4' with front wheeled walker mad assist +2 for safety Ambulation Distance (Feet): 16 Feet (4x4') Assistive device: Rolling walker Gait Pattern: Step-through pattern;Decreased stride length;Trunk flexed Gait velocity: cautious   Seated Other Seated Knee Exercises: PROM for abd  Physical Therapy Assessment and Plan PT Assessment and Plan Clinical Impression Statement: Began gait with front wheeled walker this session. Pt able to ambulate 4' x 4 with mod assist with 2 therapist with pt and 1 pushing WC. Pt requires VC's for proper hand placement with sit to stand. Pt appears to be very encouraged an excited by his progress.  PT Plan: Continue to progress pt with front wheeled walker per PT POC. D/C parallell bars.      Problem List Patient Active Problem List  Diagnoses  . Stiffness of joint, not elsewhere classified, multiple sites  . Weakness of both legs  . Contracture of hip joint  . Other general symptoms  . Blindness    . Depression  . Anxiety  . Hypertension  . H/O above knee amputation  . PVD (peripheral vascular disease)  . Hyperlipidemia  . History of stroke  . Aortic dissection    PT - End of Session Activity Tolerance: Patient tolerated treatment well General Behavior During Session: [facility] for tasks performed Cognition: [facility] for tasks performed    Rachelle Hora, PTA 11/21/2011, 11:49 AM

## 2011-11-23 ENCOUNTER — Ambulatory Visit (HOSPITAL_COMMUNITY)
Admission: RE | Admit: 2011-11-23 | Discharge: 2011-11-23 | Disposition: A | Discharge status: Home / Self Care | Payer: Medicare Other | Source: Ambulatory Visit | Attending: Family Medicine | Admitting: Family Medicine

## 2011-11-23 NOTE — Progress Notes (Signed)
Physical Therapy Treatment Patient Details  Name: Robert Campbell MRN: [id number] Date of Birth: 11-Feb-1971  Today's Date: 11/23/2011 Time: 1000-1100 PT Time Calculation (min): 60 min  Visit#: 59  of 69   Re-eval: 12/18/11 Charges: Prosthetic training x 15' Gait x 25'   Subjective: Symptoms/Limitations Symptoms: "I want to walk to the bathroom door." (Pt is referring to bathroom door in PT gym. This would be 32'.) Pain Assessment Currently in Pain?: No/denies   Exercise/Treatments Mobility/Balance  Transfers Transfers: Sit to Stand;Stand to Sit Sit to Stand: 4: Min assist Stand to Sit: 4: Min assist Ambulation/Gait Ambulation/Gait Assistance: 3: Mod assist Ambulation/Gait Assistance Details: 6',4', 4', 3', 3' Ambulation Distance (Feet): 20 Feet Assistive device: Rolling walker Gait Pattern: Step-through pattern;Decreased stride length;Trunk flexed   Seated Other Seated Knee Exercises: PROM for abd  Physical Therapy Assessment and Plan PT Assessment and Plan Clinical Impression Statement: Pt presents with improved endurance for gait training this session. Pt able to ambulate 20' (with four rest breaks) with front wheeled walker. Pt able to ambulate max of 6' without rest break. Pt requires decreased VC's for hand placement this session. Pt is without complaint throughout session. PT Plan: Continue to progress gait with walker per PT POC.     Problem List Patient Active Problem List  Diagnoses  . Stiffness of joint, not elsewhere classified, multiple sites  . Weakness of both legs  . Contracture of hip joint  . Other general symptoms  . Blindness  . Depression  . Anxiety  . Hypertension  . H/O above knee amputation  . PVD (peripheral vascular disease)  . Hyperlipidemia  . History of stroke  . Aortic dissection    PT - End of Session Activity Tolerance: Patient tolerated treatment well General Behavior During Session: [facility] for tasks performed Cognition: [facility]  for tasks performed    Rachelle Hora, PTA 11/23/2011, 12:14 PM

## 2011-11-25 ENCOUNTER — Ambulatory Visit (HOSPITAL_COMMUNITY)
Admission: RE | Admit: 2011-11-25 | Discharge: 2011-11-25 | Disposition: A | Discharge status: Home / Self Care | Payer: Medicare Other | Source: Ambulatory Visit | Attending: Family Medicine | Admitting: Family Medicine

## 2011-11-25 DIAGNOSIS — M2560 Stiffness of unspecified joint, not elsewhere classified: Secondary | ICD-10-CM

## 2011-11-25 DIAGNOSIS — M24559 Contracture, unspecified hip: Secondary | ICD-10-CM

## 2011-11-25 DIAGNOSIS — R6889 Other general symptoms and signs: Secondary | ICD-10-CM

## 2011-11-25 DIAGNOSIS — R29898 Other symptoms and signs involving the musculoskeletal system: Secondary | ICD-10-CM

## 2011-11-25 NOTE — Progress Notes (Signed)
Physical Therapy Treatment Patient Details  Name: Robert Campbell MRN: [id number] Date of Birth: 08/06/1970  Today's Date: 11/25/2011 Time: [id number] PT Time Calculation (min): 61 min  Visit#: 60  of 70   Re-eval: 01/17/12    Authorization: Medicare will start Gcodes on 7/1  Subjective: Symptoms/Limitations Symptoms: Pt has no complaints.  Excited that he is walking on the walker now    Exercise/Treatments   Stretches Passive Hamstring Stretch Limitations: Passive h/s B   3x 30"   Supine Straight Leg Raises: Both;10 reps Other Supine Knee Exercises: P/AA ROM for abduction Passive x 5' AA x 10 rep Other Supine Knee Exercises: PROM for hip flex stretch B x 3' each Sidelying Hip ABduction: 15 reps Hip ABduction Limitations: L 5# Hip ADduction: Both;10 reps Other Sidelying Knee Exercises: A/AA flex/ext B Prone  Hip Extension: AAROM;Both;15 reps      Physical Therapy Assessment and Plan PT Assessment and Plan Clinical Impression Statement: Pt doing well improving with I in mobility.    Goals    Problem List Patient Active Problem List  Diagnoses  . Stiffness of joint, not elsewhere classified, multiple sites  . Weakness of both legs  . Contracture of hip joint  . Other general symptoms  . Blindness  . Depression  . Anxiety  . Hypertension  . H/O above knee amputation  . PVD (peripheral vascular disease)  . Hyperlipidemia  . History of stroke  . Aortic dissection    PT - End of Session Activity Tolerance: Patient tolerated treatment well General Behavior During Session: [facility] for tasks performed Cognition: [facility] for tasks performed PT Plan of Care PT Home Exercise Plan: Continue hip abduction stretches  GP No functional reporting required  Lily Kernen,[name] 11/25/2011, 11:00 AM

## 2011-11-30 ENCOUNTER — Ambulatory Visit (HOSPITAL_COMMUNITY)
Admission: RE | Admit: 2011-11-30 | Discharge: 2011-11-30 | Disposition: A | Discharge status: Home / Self Care | Payer: Medicare Other | Source: Ambulatory Visit | Attending: Family Medicine | Admitting: Family Medicine

## 2011-11-30 NOTE — Progress Notes (Signed)
Physical Therapy Treatment Patient Details  Name: Robert Campbell MRN: [id number] Date of Birth: 12/15/1970  Today's Date: 11/30/2011 Time: [id number] PT Time Calculation (min): 64 min  Visit#: [id number]  of 70   Re-eval: 01/17/12 Charges: Gait x 30' Therex 10'  Subjective: Symptoms/Limitations Symptoms: Pt is without complaint and is pain free. Pain Assessment Currently in Pain?: No/denies   Exercise/Treatments Mobility/Balance  Transfers Transfers: Sit to Stand;Stand to Sit Sit to Stand: 4: Min assist Stand to Sit: 4: Min assist Ambulation/Gait Ambulation/Gait Assistance: 3: Mod assist Ambulation/Gait Assistance Details: 2', 8', 6', 6', 6', Ambulation Distance (Feet): 28 Feet Assistive device: Rolling walker Gait Pattern: Step-through pattern;Decreased stride length;Trunk flexed Gait velocity: Increased from last session   Seated Other Seated Knee Exercises: AROM for abd/add x 20 Other Seated Knee Exercises: PROM for abd  Physical Therapy Assessment and Plan PT Assessment and Plan Clinical Impression Statement: Pt able to increase walking distance this session to 8' max at a time. Pt also presents with improved stride length this session. Pt is without complaint throughout session. PT Plan: Continue to progress per PT POC.     Problem List Patient Active Problem List  Diagnoses  . Stiffness of joint, not elsewhere classified, multiple sites  . Weakness of both legs  . Contracture of hip joint  . Other general symptoms  . Blindness  . Depression  . Anxiety  . Hypertension  . H/O above knee amputation  . PVD (peripheral vascular disease)  . Hyperlipidemia  . History of stroke  . Aortic dissection    PT - End of Session Activity Tolerance: Patient tolerated treatment well General Behavior During Session: [facility] for tasks performed Cognition: [facility] for tasks performed  Rachelle Hora, PTA 11/30/2011, 11:55 AM

## 2011-12-02 ENCOUNTER — Ambulatory Visit (HOSPITAL_COMMUNITY)
Admission: RE | Admit: 2011-12-02 | Discharge: 2011-12-02 | Disposition: A | Discharge status: Home / Self Care | Payer: Medicare Other | Source: Ambulatory Visit | Attending: Family Medicine | Admitting: Family Medicine

## 2011-12-02 DIAGNOSIS — M2560 Stiffness of unspecified joint, not elsewhere classified: Secondary | ICD-10-CM

## 2011-12-02 DIAGNOSIS — R29898 Other symptoms and signs involving the musculoskeletal system: Secondary | ICD-10-CM

## 2011-12-02 DIAGNOSIS — R6889 Other general symptoms and signs: Secondary | ICD-10-CM

## 2011-12-02 NOTE — Progress Notes (Signed)
Physical Therapy Treatment Patient Details  Name: Robert Campbell MRN: [id number] Date of Birth: Dec 12, 1970  Today's Date: 12/02/2011 Time: 1013-1108 PT Time Calculation (min): 55 min  Visit#: 62  of 70   Re-eval: 01/17/12   Subjective: Symptoms/Limitations Symptoms: Pt complains of pain in his R insicion with R side-lying.  Theapist inspected incision superior aspect is swollen, red and hot recommended calling MD for antibiotic.   Exercise/Treatments   Stretches Passive Hamstring Stretch Limitations: Passive h/s B   3x 30" Seated Other Seated Knee Exercises: back to back with pt pulling forward. Supine Straight Leg Raises: Both;10 reps Other Supine Knee Exercises: P/AA ROM for abduction Passive x 5' AA x 10 rep Other Supine Knee Exercises: PROM for hip flex stretch B x 3' each Sidelying Hip ABduction: Right;15 reps Hip ABduction Limitations: no R sidelying so no L ab Hip ADduction: Left;10 reps Other Sidelying Knee Exercises: A/AA flex/ext B Prone  Hip Extension: AAROM;Both;15 reps      Physical Therapy Assessment and Plan PT Assessment and Plan Clinical Impression Statement: Pt incision appears infected; Pt told to call MD for antibiotics Rehab Potential: Good PT Plan: assess R insicion next visit.    Goals    Problem List Patient Active Problem List  Diagnoses  . Stiffness of joint, not elsewhere classified, multiple sites  . Weakness of both legs  . Contracture of hip joint  . Other general symptoms  . Blindness  . Depression  . Anxiety  . Hypertension  . H/O above knee amputation  . PVD (peripheral vascular disease)  . Hyperlipidemia  . History of stroke  . Aortic dissection    PT - End of Session Activity Tolerance: Patient tolerated treatment well General Behavior During Session: [facility] for tasks performed Cognition: [facility] for tasks performed  GP No functional reporting required  Lessie Manigo,[name] 12/02/2011, 12:02 PM

## 2011-12-05 ENCOUNTER — Ambulatory Visit (HOSPITAL_COMMUNITY)
Admission: RE | Admit: 2011-12-05 | Discharge: 2011-12-05 | Disposition: A | Discharge status: Home / Self Care | Payer: Medicare Other | Source: Ambulatory Visit | Attending: Family Medicine | Admitting: Family Medicine

## 2011-12-05 NOTE — Progress Notes (Signed)
Physical Therapy Treatment Patient Details  Name: Robert Campbell MRN: [id number] Date of Birth: 11/16/1970  Today's Date: 12/05/2011 Time: [id number] PT Time Calculation (min): 58 min Visit#: 63  of 70   Re-eval: 12/18/11 Charges: Therex x 42'   Subjective: Symptoms/Limitations Symptoms: I got an antibiotic from the doctor for my incision. Pain Assessment Currently in Pain?: No/denies   Exercise/Treatments Seated Other Seated Knee Exercises: adducto stretch with ball between legs 4x15" Other Seated Knee Exercises: Add ball sqeeze x 20; iso abd 2x10 Supine Straight Leg Raises: Both;10 reps Other Supine Knee Exercises: PROM for flexion 1' B Prone  Hip Extension: AAROM;Both;15 reps   Physical Therapy Assessment and Plan PT Assessment and Plan Clinical Impression Statement: Pt's incision is now open. Wound assessed by Azucena Freed, PT. Wound dressed with hydrogel and 2x2 and anchored with medipore tape. Gait training held this session secondary to open wound. Pt completes mat exercises well with noted improvement in hip extensor strength. PT Plan: Reassess incision next session.     Problem List Patient Active Problem List  Diagnoses  . Stiffness of joint, not elsewhere classified, multiple sites  . Weakness of both legs  . Contracture of hip joint  . Other general symptoms  . Blindness  . Depression  . Anxiety  . Hypertension  . H/O above knee amputation  . PVD (peripheral vascular disease)  . Hyperlipidemia  . History of stroke  . Aortic dissection    PT - End of Session Activity Tolerance: Patient tolerated treatment well General Behavior During Session: [facility] for tasks performed Cognition: [facility] for tasks performed   Rachelle Hora, PTA 12/05/2011, 12:20 PM

## 2011-12-07 ENCOUNTER — Ambulatory Visit (HOSPITAL_COMMUNITY)
Admission: RE | Admit: 2011-12-07 | Discharge: 2011-12-07 | Disposition: A | Discharge status: Home / Self Care | Payer: Medicare Other | Source: Ambulatory Visit | Attending: Family Medicine | Admitting: Family Medicine

## 2011-12-07 DIAGNOSIS — R29898 Other symptoms and signs involving the musculoskeletal system: Secondary | ICD-10-CM

## 2011-12-07 DIAGNOSIS — M24559 Contracture, unspecified hip: Secondary | ICD-10-CM

## 2011-12-07 DIAGNOSIS — R6889 Other general symptoms and signs: Secondary | ICD-10-CM

## 2011-12-07 DIAGNOSIS — M2560 Stiffness of unspecified joint, not elsewhere classified: Secondary | ICD-10-CM

## 2011-12-07 NOTE — Progress Notes (Signed)
Physical Therapy Treatment Patient Details  Name: Robert Campbell MRN: [id number] Date of Birth: 28-Oct-1970  Today's Date: 12/07/2011 Time: 1013-1104 PT Time Calculation (min): 51 min  Visit#: 64  of 70   Re-eval: 12/19/11    Subjective: Symptoms/Limitations Symptoms: Pt states wound not as painful.  Wound checked not as hypertrophic explained to keep shrinker over wound to add pressure to it.   Exercise/Treatments    Stretches Passive Hamstring Stretch Limitations: Passive h/s B   3x 30" Seated Other Seated Knee Exercises: back to back with pt pulling forward. Supine Straight Leg Raises: Both;10 reps Other Supine Knee Exercises: P/AA ROM for abduction Passive x 5' AA x 10 rep Other Supine Knee Exercises: PROM for hip flex stretch B x 3' each Sidelying Hip ABduction: Right;15 reps Hip ABduction Limitations: L manual ressistande Hip ADduction: Left;10 reps Other Sidelying Knee Exercises: A/AA flex/ext B Prone  Hip Extension: AAROM;Both;15 reps    Physical Therapy Assessment and Plan PT Assessment and Plan Clinical Impression Statement: wound is not as hypertrophic but has not decreased in size.  Continue to not use prothesis until wound has healed.    Goals    Problem List Patient Active Problem List  Diagnoses  . Stiffness of joint, not elsewhere classified, multiple sites  . Weakness of both legs  . Contracture of hip joint  . Other general symptoms  . Blindness  . Depression  . Anxiety  . Hypertension  . H/O above knee amputation  . PVD (peripheral vascular disease)  . Hyperlipidemia  . History of stroke  . Aortic dissection    PT - End of Session Activity Tolerance: Patient tolerated treatment well General Behavior During Session: [facility] for tasks performed  GP No functional reporting required  Zhania Shaheen,[name] 12/07/2011, 12:05 PM

## 2011-12-09 ENCOUNTER — Ambulatory Visit (HOSPITAL_COMMUNITY)
Admission: RE | Admit: 2011-12-09 | Discharge: 2011-12-09 | Disposition: A | Discharge status: Home / Self Care | Payer: Medicare Other | Source: Ambulatory Visit | Attending: Family Medicine | Admitting: Family Medicine

## 2011-12-09 DIAGNOSIS — M24559 Contracture, unspecified hip: Secondary | ICD-10-CM

## 2011-12-09 DIAGNOSIS — M2560 Stiffness of unspecified joint, not elsewhere classified: Secondary | ICD-10-CM

## 2011-12-09 DIAGNOSIS — R29898 Other symptoms and signs involving the musculoskeletal system: Secondary | ICD-10-CM

## 2011-12-09 DIAGNOSIS — R6889 Other general symptoms and signs: Secondary | ICD-10-CM

## 2011-12-09 NOTE — Progress Notes (Signed)
Physical Therapy Treatment Patient Details  Name: Robert Campbell MRN: [id number] Date of Birth: Jan 29, 1971  Today's Date: 12/09/2011 Time: [id number] PT Time Calculation (min): 59 min  Visit#: 65  of 70   Re-eval: 12/19/11    Subjective: Symptoms/Limitations Symptoms: Pt states he now has a second wound.  Primary wound is hypertrophic again with clear drainage.  Second wound is 2" distal but has purulent drainage.  Pt counselled to seek out MD who put special rod into R LE to get wound to heal in the first place.    Exercise/Treatments      Stretches Passive Hamstring Stretch Limitations: Passive h/s B   3x 30" Seated Other Seated Knee Exercises: back to back with pt pulling forward. Supine Straight Leg Raises: Both;10 reps Other Supine Knee Exercises: P/AA for both ab and ext with PROM held x 5'  Sidelying Hip ABduction: Right;15 reps Hip ADduction: Left;10 reps Other Sidelying Knee Exercises: A/AA flex/ext R only Prone  Hip Extension: AAROM;Both;15 reps Other Prone Exercises: SAR/Double AR B x 10; horizontal ab B x 10, W back and B shoulder ext with 2# x 10. Other Prone Exercises: swimmer x 10; press up 1/4 way hold x 10.      Physical Therapy Assessment and Plan PT Assessment and Plan Clinical Impression Statement: Pt wound hypertrophic again with second wound opening with purulent drainage.  Pt is to contact orthopod at [facility] who completed his surgery.  Will stay off R side until wound heals.Pt fatiques quickly with prone shoulder/back stab   exercises Rehab Potential: Good PT Plan: refer to ortho.  No R sidelying or walking until area is healed.    Goals    Problem List Patient Active Problem List  Diagnoses  . Stiffness of joint, not elsewhere classified, multiple sites  . Weakness of both legs  . Contracture of hip joint  . Other general symptoms  . Blindness  . Depression  . Anxiety  . Hypertension  . H/O above knee amputation  . PVD (peripheral  vascular disease)  . Hyperlipidemia  . History of stroke  . Aortic dissection    PT - End of Session Activity Tolerance: Patient limited by fatigue;Patient tolerated treatment well General Behavior During Session: [facility] for tasks performed Cognition: [facility] for tasks performed PT Plan of Care PT Home Exercise Plan: pt to begin shoulder/back stab exercises at home.  GP No functional reporting required  Aranza Geddes,[name] 12/09/2011, 12:00 PM

## 2011-12-14 ENCOUNTER — Ambulatory Visit (HOSPITAL_COMMUNITY)
Admission: RE | Admit: 2011-12-14 | Discharge: 2011-12-14 | Disposition: A | Discharge status: Home / Self Care | Payer: Medicare Other | Source: Ambulatory Visit | Attending: Family Medicine | Admitting: Family Medicine

## 2011-12-14 NOTE — Progress Notes (Signed)
Physical Therapy Treatment Patient Details  Name: Robert Campbell MRN: [id number] Date of Birth: August 28, 1970  Today's Date: 12/14/2011 Time: [id number] PT Time Calculation (min): 57 min Visit#: [id number]  of 70   Re-eval: 12/19/11 Charges: Therex x 45'  Authorization: Medicare will start Gcodes on 7/1    Subjective: Symptoms/Limitations Symptoms: Pt states that he went to [facility] and saw the doctor who put the rod in his leg. He is supposed to go back 12/27/11 to see if he will need surgery, Pain Assessment Currently in Pain?: No/denies   Exercise/Treatments Stretches Passive Hamstring Stretch Limitations: Passive h/s B x1' B Seated Other Seated Knee Exercises: back to back with pt pulling forward. Other Seated Knee Exercises: Add ball sqeeze x 20; iso abd x 10; Adductor stretch 2x2' Supine Straight Leg Raises: Both;10 reps Prone  Hip Extension: AAROM;Both;15 reps Other Prone Exercises: SAR B x 10; W back and B shoulder ext with 2# x 10.  Physical Therapy Assessment and Plan PT Assessment and Plan Clinical Impression Statement: Pt requires manual assistance to avoid rolling hips to one side with prone exercises. Pt also requires multimodal cueing to correctly complete scapular exercises in prone. Pt is keeping wound covered and is currently taking oral antibiotics.  PT Plan: Continue to progress per PT POC. No R sidelying or gait training until area is healed.     Problem List Patient Active Problem List  Diagnoses  . Stiffness of joint, not elsewhere classified, multiple sites  . Weakness of both legs  . Contracture of hip joint  . Other general symptoms  . Blindness  . Depression  . Anxiety  . Hypertension  . H/O above knee amputation  . PVD (peripheral vascular disease)  . Hyperlipidemia  . History of stroke  . Aortic dissection    PT - End of Session Activity Tolerance: Patient tolerated treatment well General Behavior During Session: [facility] for tasks  performed Cognition: [facility] for tasks performed  Rachelle Hora, PTA 12/14/2011, 11:27 AM

## 2011-12-16 ENCOUNTER — Ambulatory Visit (HOSPITAL_COMMUNITY)
Admission: RE | Admit: 2011-12-16 | Discharge: 2011-12-16 | Disposition: A | Discharge status: Home / Self Care | Payer: Medicare Other | Source: Ambulatory Visit

## 2011-12-16 DIAGNOSIS — M24559 Contracture, unspecified hip: Secondary | ICD-10-CM

## 2011-12-16 DIAGNOSIS — R29898 Other symptoms and signs involving the musculoskeletal system: Secondary | ICD-10-CM

## 2011-12-16 DIAGNOSIS — R6889 Other general symptoms and signs: Secondary | ICD-10-CM

## 2011-12-16 NOTE — Progress Notes (Signed)
Physical Therapy Treatment Patient Details  Name: Robert Campbell MRN: [id number] Date of Birth: Nov 17, 1970  Today's Date: 12/16/2011 Time: 1018-1105 PT Time Calculation (min): 47 min  Visit#: 59  of 70   Re-eval: 12/19/11  Charge: therex 45 min  Subjective: Symptoms/Limitations Symptoms: Pt stated R LE pain scale 6/10, has been off R side completely since visit with [facility]. Pain Assessment Currently in Pain?: Yes Pain Score:   6 Pain Location: Leg Pain Orientation: Right  Objective:   Exercise/Treatments Stretches Passive Hamstring Stretch Limitations: Passive h/s B x1' B Stretches Passive Hamstring Stretch Limitations: Passive h/s B x1' B Hip Flexor Stretch: 3 reps;30 seconds Seated Other Seated Knee Exercises: iso abd x 10x 5" B; Adductor stretch 2x2' Supine Straight Leg Raises: Both;10 reps Other Supine Knee Exercises: P/AA for both ab and ext with PROM held 2 sets 2' hold Prone  Hip Extension: AAROM;Both;15 reps Other Prone Exercises: SAR B x 10; W back and B shoulder ext with 2# x 10. Other Prone Exercises: push ups 20      Physical Therapy Assessment and Plan PT Assessment and Plan Clinical Impression Statement: Multimodal cueing required for correct mm activation to reduce compensation especially with prone hip extension, AAROM required.  Pt is continuing to complete wound care at home, wound dressed at entrance of session and reported oral antibiotic medication, goes back to [facility] in June, 2013. PT Plan: Continue to progress per PT POC. No R sidelying or gait training until area is healed.    Goals    Problem List Patient Active Problem List  Diagnoses  . Stiffness of joint, not elsewhere classified, multiple sites  . Weakness of both legs  . Contracture of hip joint  . Other general symptoms  . Blindness  . Depression  . Anxiety  . Hypertension  . H/O above knee amputation  . PVD (peripheral vascular disease)  . Hyperlipidemia  . History  of stroke  . Aortic dissection    PT - End of Session Activity Tolerance: Patient tolerated treatment well General Behavior During Session: [facility] for tasks performed Cognition: [facility] for tasks performed  GP No functional reporting required  Aldona Lento, PTA 12/16/2011, 12:16 PM

## 2011-12-19 ENCOUNTER — Ambulatory Visit (HOSPITAL_COMMUNITY)
Admission: RE | Admit: 2011-12-19 | Discharge: 2011-12-19 | Disposition: A | Discharge status: Home / Self Care | Payer: Medicare Other | Source: Ambulatory Visit | Attending: Family Medicine | Admitting: Family Medicine

## 2011-12-19 DIAGNOSIS — M6281 Muscle weakness (generalized): Secondary | ICD-10-CM | POA: Insufficient documentation

## 2011-12-19 DIAGNOSIS — S78119A Complete traumatic amputation at level between unspecified hip and knee, initial encounter: Secondary | ICD-10-CM | POA: Insufficient documentation

## 2011-12-19 DIAGNOSIS — Z5189 Encounter for other specified aftercare: Secondary | ICD-10-CM | POA: Insufficient documentation

## 2011-12-19 DIAGNOSIS — R269 Unspecified abnormalities of gait and mobility: Secondary | ICD-10-CM | POA: Insufficient documentation

## 2011-12-19 NOTE — Progress Notes (Signed)
Physical Therapy Treatment Patient Details  Name: Robert Campbell MRN: [id number] Date of Birth: 03/15/1971  Today's Date: 12/19/2011 Time: [id number] PT Time Calculation (min): 55 min  Visit#: [id number]  of 70   Re-eval: 12/19/11 Charges: Therex x 45'   Subjective: Symptoms/Limitations Symptoms: Pt states that his wound is closing up. (A family member is caring for wound at home.) Pain Assessment Currently in Pain?: No/denies   Exercise/Treatments  Seated Other Seated Knee Exercises: back to back with pt pulling forward. Other Seated Knee Exercises: iso abd/add 20x 5" B; Adductor stretch x3' w/ forawrd pull 3x15" Supine Straight Leg Raises: 10 reps;AAROM;Both Other Supine Knee Exercises: PROM hip flexion x 2' B Prone  Hip Extension: AAROM;Both;15 reps Other Prone Exercises: SAR B x 10; double arem raise x 10; W back with 2# x 10; horizontal abd x 10  (with manual assistance for trunk stability) Other Prone Exercises: push ups 20   Physical Therapy Assessment and Plan PT Assessment and Plan Clinical Impression Statement: Pt presents with improved scapular mm contraciton with prone exercises. Pt requires manual assistand to avoid lower trunk rotation with prone exercises. Pt presents with improved hip flexion ROM seated back to back stretch. PT Plan: Continue to progress per PT POC. No R sidelying or gait training until area is healed.     Problem List Patient Active Problem List  Diagnoses  . Stiffness of joint, not elsewhere classified, multiple sites  . Weakness of both legs  . Contracture of hip joint  . Other general symptoms  . Blindness  . Depression  . Anxiety  . Hypertension  . H/O above knee amputation  . PVD (peripheral vascular disease)  . Hyperlipidemia  . History of stroke  . Aortic dissection    PT - End of Session Activity Tolerance: Patient tolerated treatment well General Behavior During Session: [facility] for tasks performed Cognition: [facility] for tasks  performed  Rachelle Hora, PTA 12/19/2011, 12:13 PM

## 2011-12-21 ENCOUNTER — Ambulatory Visit (HOSPITAL_COMMUNITY)
Admission: RE | Admit: 2011-12-21 | Discharge: 2011-12-21 | Disposition: A | Discharge status: Home / Self Care | Payer: Medicare Other | Source: Ambulatory Visit | Attending: Family Medicine | Admitting: Family Medicine

## 2011-12-21 NOTE — Progress Notes (Signed)
Physical Therapy Treatment Patient Details  Name: Robert Campbell MRN: [id number] Date of Birth: 04/09/1971  Today's Date: 12/21/2011 Time: 1015-1108 PT Time Calculation (min): 53 min  Visit#: 69  of 82   Re-eval: 01/20/12 Charges: MMT x 1 ROMM x 1   Authorization: Medicare will start Gcodes on 7/1    Subjective: Symptoms/Limitations Symptoms: Pt is pain free and without complaint. Pain Assessment Currently in Pain?: No/denies  Objective: RLE AROM (degrees)  Right Hip Extension 0-30: Rests at -3 AROM -3 PROM 5(-18 was -18 PROM 2 was 0)  Right Hip Flexion 0-125: Rests at 10 AROM 20 (was 10) PROM 60 (Was AROM 10; 10 Passive to 60)  Right Hip ABduction 0-45: rests at -10 AROM -5 Passive 10(was rests at -12 active to -9 passive to 0)   RLE Strength  Right Hip Flexion: 2/5 (was 1+) Right Hip Extension: 3-/5  Right Hip ABduction: 2/5 (was 2-/5)  Right Hip ADduction: 3+/5   LLE AROM (degrees)  Left Hip Extension 0-30: AROM -17 PROM  0(was AROM -17 ; PROM --5 )  Left Hip Flexion 0-125: AROM 35 (was 33) PROM 70(was 60)  Left Hip ABduction 0-45: Rests at 0 AROM 2 PROM 18 (was rests at -10; active to -5 passive to 0)   LLE Strength  Left Hip Flexion: 2/5 (was 2-/5)  Left Hip Extension: 2-/5  Left Hip ABduction: 4/5 (was 3-/5)  Left Hip ADduction: 3+/5    Physical Therapy Assessment and Plan PT Assessment and Plan Clinical Impression Statement: Pt presents with improvements in ROM and strength. Pt has been unable to complete gait training secondary to wound on R lateral thigh. Before wound developed pt was able to walk 8' with mod assist with front wheel walker.  New goals have been made and discussed with Azucena Freed, PT.  PT Plan: Recommend to continue 3xwk x 4 wks.    Goals PT Short Term Goals PT Short Term Goal 1: Pt to be able to ambulate 20' with walker with mod assist (Goal set 12/21/11) PT Short Term Goal 2: ROM to be improved 5 degrees (Goal set 12/21/11) PT Short  Term Goal 3: mm strength increased by 1/2 grade. new goal as of 11/18/2011 PT Short Term Goal 3 - Progress: Progressing toward goal  Problem List Patient Active Problem List  Diagnoses  . Stiffness of joint, not elsewhere classified, multiple sites  . Weakness of both legs  . Contracture of hip joint  . Other general symptoms  . Blindness  . Depression  . Anxiety  . Hypertension  . H/O above knee amputation  . PVD (peripheral vascular disease)  . Hyperlipidemia  . History of stroke  . Aortic dissection    PT - End of Session Activity Tolerance: Patient tolerated treatment well General Behavior During Session: [facility] for tasks performed Cognition: [facility] for tasks performed  Rachelle Hora, PTA 12/21/2011, 1:10 PM

## 2011-12-23 ENCOUNTER — Ambulatory Visit (HOSPITAL_COMMUNITY)
Admission: RE | Admit: 2011-12-23 | Discharge: 2011-12-23 | Disposition: A | Discharge status: Home / Self Care | Payer: Medicare Other | Source: Ambulatory Visit | Attending: Family Medicine | Admitting: Family Medicine

## 2011-12-23 DIAGNOSIS — R29898 Other symptoms and signs involving the musculoskeletal system: Secondary | ICD-10-CM

## 2011-12-23 DIAGNOSIS — R6889 Other general symptoms and signs: Secondary | ICD-10-CM

## 2011-12-23 NOTE — Progress Notes (Signed)
Physical Therapy Treatment Patient Details  Name: Robert Campbell MRN: [id number] Date of Birth: 1970-09-06  Today's Date: 12/23/2011 Time: 1015-1108 PT Time Calculation (min): 53 min  Visit#: 70  of 97   Re-eval: 01/20/12    Authorization:    Authorization Time Period:    Authorization Visit#:   of     Subjective: Symptoms/Limitations Symptoms: Pt going to MD next week to see if he will need surgery on his wound on not.  Precautions/Restrictions     Exercise/Treatments Mobility/Balance        Stretches Best boy Limitations: Passive h/s B x1' B Hip Flexor Stretch: 3 reps;30 seconds   Supine Straight Leg Raises: Both;10 reps Other Supine Knee Exercises: P/AA for both ab and ext with PROM held 2 sets 2' hold Sidelying Hip ABduction: Right;15 reps Prone  Hip Extension: AAROM;Both;15 reps Other Prone Exercises: SAR B x 10; W back and B shoulder ext with 2# x 10. Other Prone Exercises: B shoulder horizontal ab, B double arm raist, push up x 15      Physical Therapy Assessment and Plan PT Assessment and Plan Clinical Impression Statement: Pt continues to improve in stability of back with B prone UE ex as well as LE strength and ROM.  Awaiting MD opinion on Gt since wound is not on a WB area. PT Plan: progress with stability; begin gt if okay by MD       Problem List Patient Active Problem List  Diagnoses  . Stiffness of joint, not elsewhere classified, multiple sites  . Weakness of both legs  . Contracture of hip joint  . Other general symptoms  . Blindness  . Depression  . Anxiety  . Hypertension  . H/O above knee amputation  . PVD (peripheral vascular disease)  . Hyperlipidemia  . History of stroke  . Aortic dissection    PT - End of Session Activity Tolerance: Patient tolerated treatment well General Behavior During Session: [facility] for tasks performed Cognition: [facility] for tasks performed  GP No functional reporting  required  Danen Lapaglia,[name] 12/23/2011, 11:12 AM

## 2011-12-26 ENCOUNTER — Ambulatory Visit (HOSPITAL_COMMUNITY)
Admission: RE | Admit: 2011-12-26 | Discharge: 2011-12-26 | Disposition: A | Discharge status: Home / Self Care | Payer: Medicare Other | Source: Ambulatory Visit | Attending: Family Medicine | Admitting: Family Medicine

## 2011-12-26 DIAGNOSIS — R29898 Other symptoms and signs involving the musculoskeletal system: Secondary | ICD-10-CM

## 2011-12-26 DIAGNOSIS — M24559 Contracture, unspecified hip: Secondary | ICD-10-CM

## 2011-12-26 DIAGNOSIS — R6889 Other general symptoms and signs: Secondary | ICD-10-CM

## 2011-12-26 NOTE — Progress Notes (Signed)
Physical Therapy Treatment Patient Details  Name: Robert Campbell MRN: [id number] Date of Birth: Feb 19, 1971  Today's Date: 12/26/2011 Time: 1015-1111 PT Time Calculation (min): 56 min  Visit#: [id number]  of 82   Re-eval: 01/20/12  Charge: therex 50 min  Authorization: Medicare, need to start g code  Authorization Time Period:    Authorization Visit#:   of     Subjective: Symptoms/Limitations Symptoms: R LE pain scale 3-4/10, going to MD in [place] tomorrow concerning wound. Pain Assessment Currently in Pain?: Yes Pain Score:   4 Pain Location: Leg Pain Orientation: Right  Objective:  Exercise/Treatments Stretches Passive Hamstring Stretch Limitations: Passive h/s B x1' B Hip Flexor Stretch: 3 reps;30 seconds Supine Straight Leg Raises: AAROM;Both;15 reps Other Supine Knee Exercises: P/AA for both ab and ext with PROM held 2 sets 2' hold Sidelying left only due to wound on R Hip ABduction: Right;15 reps;Limitations Hip ABduction Limitations: L SL only due to wound on R side Other Sidelying Knee Exercises: AA hip flexion and hip extension with R LE only; S Sidelying10x 10" holds Prone  Hip Extension: AAROM;Both;15 reps Other Prone Exercises: SAR B x 10; W back and B shoulder ext with 2# x 10. Other Prone Exercises: B shoulder horizontal ab, B double arm raist, push up x 11      Physical Therapy Assessment and Plan PT Assessment and Plan Clinical Impression Statement: Pt continue to improve in stability of back wtih B UE in prone position, though still weaker R UE than L and does compensate with R UE Shoulder flexion in prone.  Manual assistance/vc-ing to deactivate  R glut constraction with L hip extension.  Still awaiting MD opinon on gait since wound in not on a WB area.   PT Plan: Pt to see MD tomorrow, assess MD opinion with gait,  Continue progressing stability towards PT POC goals.    Goals    Problem List Patient Active Problem List  Diagnoses  . Stiffness of  joint, not elsewhere classified, multiple sites  . Weakness of both legs  . Contracture of hip joint  . Other general symptoms  . Blindness  . Depression  . Anxiety  . Hypertension  . H/O above knee amputation  . PVD (peripheral vascular disease)  . Hyperlipidemia  . History of stroke  . Aortic dissection    PT - End of Session Activity Tolerance: Patient tolerated treatment well General Behavior During Session: [facility] for tasks performed Cognition: [facility] for tasks performed  GP No functional reporting required  Aldona Lento, PTA 12/26/2011, 5:28 PM

## 2011-12-27 ENCOUNTER — Telehealth (HOSPITAL_COMMUNITY): Payer: Self-pay

## 2011-12-28 ENCOUNTER — Ambulatory Visit (HOSPITAL_COMMUNITY): Payer: Self-pay | Admitting: *Deleted

## 2011-12-30 ENCOUNTER — Ambulatory Visit (HOSPITAL_COMMUNITY): Payer: Self-pay | Admitting: *Deleted

## 2012-01-02 ENCOUNTER — Ambulatory Visit (HOSPITAL_COMMUNITY): Payer: Self-pay | Admitting: *Deleted

## 2012-01-04 ENCOUNTER — Ambulatory Visit (HOSPITAL_COMMUNITY): Payer: Self-pay | Admitting: *Deleted

## 2012-01-06 ENCOUNTER — Ambulatory Visit (HOSPITAL_COMMUNITY): Payer: Self-pay | Admitting: Physical Therapy

## 2012-01-09 ENCOUNTER — Ambulatory Visit (HOSPITAL_COMMUNITY): Payer: Self-pay | Admitting: *Deleted

## 2012-01-09 ENCOUNTER — Ambulatory Visit (HOSPITAL_COMMUNITY)
Admission: RE | Admit: 2012-01-09 | Discharge: 2012-01-09 | Disposition: A | Discharge status: Home / Self Care | Payer: Medicare Other | Source: Ambulatory Visit | Attending: Family Medicine | Admitting: Family Medicine

## 2012-01-09 NOTE — Evaluation (Signed)
Occupational Therapy Evaluation  Patient Details  Name: Robert Campbell MRN: [id number] Date of Birth: Jul 03, 1971  Today's Date: 01/09/2012 Time: [id number] OT Time Calculation (min): 35 min  Visit#: 1  of 1   Past Medical History:  Past Medical History  Diagnosis Date  . Hypertension   . Anxiety   . Depression   . Blindness     right eye, low vision left eye  . H/O above knee amputation   . PVD (peripheral vascular disease)   . Hyperlipidemia   . History of stroke   . 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 Abdominal surgery   . Amputee    01/09/12 RE:     Robert Campbell  [address]  [place], [place] [address]  DOB:  03-20-2071  Insurance:  Medicare and Medicaid  Robert Campbell was referred to this clinic for a power wheelchair assessment today due to difficulty walking.  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  He was in the hospital for almost a year, and when he was finally discharged, he did not receive any type of therapy for almost 2 years.  He has been receiving outpatient physical therapy for conditioning in preparation for gait training with his prosthetics since November of 2012.  His physical therapy has been put on hold effective 12/23/11 due to chronic osteomyelitis.  He is having surgery on 01/25/12 to remove a portion of his femur.  In addition to his bilateral amputations, he is morbidly obese (he has gained over 100 pounds since his aneurysm), diabetic, and has high blood pressure.    Robert Campbell lives with his grandmother, who is not able to care for herself.  He had an aide to assist with his BADLs daily until the beginning of the year.  His insurance is no longer paying for an aide, so he pays for one privately 3 days a week, and his aunt helps him with his BADLs the other days.  He requires assistance with  transferring to his bedside commode, emptying his urinal, and with IADLs.     Robert Campbell states that a power wheelchair will "help with his walking, as it will allow him to raise up to don his prosthetics, and will allow for a stable support during transfers."    A FULL PHYSICAL ASSESSMENT REVEALS THE FOLLOWING    Existing Equipment: Robert Campbell has a power chair that he obtained in 2011 from the [employer].  He states that the chair was given to him and that no insurance company paid for it.  On September 26, 2011 he fell off the back of the transportation [employer] in his driveway.  The fall damaged his wheelchair; however, it is still functional in use.  His current chair's arm rests are too short for him to safely use during transfers.  His current chair tilts and reclines, but does not elevate.  In order to don his prosthetics, he requires elevation.  He has a standard wheelchair, but is not able to propel it household distances due to his weight.  He has a bedside commode and a hospital bed. Transfers:   Robert Campbell is able to transfer from his bed to his chair independently.  He requires assistance with tub and toilet transfers. Head and Neck: WFL.   Trunk:  WFL.  Pelvis:    WFL.  Hip:    Right Hip Extension: Rests at -3 AROM -3 PROM 5 Right Hip Flexion: Rests at 10 AROM 20 PROM 60 Right Hip Abduction rests at -10 AROM -5 Passive 10 RLE Strength  Right Hip Flexion: 2/5  Right Hip Extension: 3-/5  Right Hip Abduction: 2/5  Right Hip Adduction: 3+/5  Left Hip Extension: AROM -17 PROM 0 Left Hip Flexion 0: AROM 35 PROM 70  Left Hip Abduction: Rests at 0 AROM 2 PROM 18  Left Hip Flexion: 2/5 (was 2-/5)  Left Hip Extension: 2-/5  Left Hip Abduction: 4/5 (was 3-/5)  Left Hip Adduction: 3+/5 Bilateral     Knees: N/A due to amputation  Feet and Ankles: N/A due to amputation Upper Extremities: Robert Campbell has WFL AROM and 5/5 strength in BUE.  Weight Shifting Ability: Robert Campbell is independent with  weight shifting.  Skin Integrity: N/A.    Cognition:  WFL.  Activity Tolerance:  Fair +   GOALS/OBJECTIVE OF SEATING INTERVENTION  Recommendations: Robert Campbell would benefit from a power wheelchair for use in his home.  He is unable to independently propel a standard wheelchair household distances due to his weight.  He has not advanced enough in his rehabilitation to independently ambulate with his prosthetics.    A power wheelchair with spider suspension with extended arm rests for safety with transfers, elevation and tilt/recline for self-care would increase Robert Campbell safety, independence with daily activities, and his quality of life.    If you require any further information concerning Robert Campbell positioning, independence or mobility needs; or any further information why a lesser device will not work, please do not hesitate to contact me at [facility], [place] [address], [place] [address] [phone].   Thank you for this referral,   ____________________       __________ Penelope Galas, OTR/L        Date    Problem List Patient Active Problem List  Diagnosis  . Stiffness of joint, not elsewhere classified, multiple sites  . Weakness of both legs  . Contracture of hip joint  . Other general symptoms  . Blindness  . Depression  . Anxiety  . Hypertension  . H/O above knee amputation  . PVD (peripheral vascular disease)  . Hyperlipidemia  . History of stroke  . Aortic dissection       GO  Functional Reporting Modifier  Current Status  [phone] - Self Care CL - At least 60% but less than 80% impaired, limited or restricted  Goal Status  [phone] - Self Care CL - At least 60% but less than 80% impaired, limited or restricted  Discharge Status [place], OTR/L  01/09/2012, 2:58 PM  Physician Documentation Your signature is required to indicate approval of the treatment plan as stated above.  Please sign and either send electronically or  make a copy of this report for your files and return this physician signed original.  Please mark one 1.__approve of plan  2. ___approve of plan with the following conditions.   ______________________________  _____________________ Physician Signature                                                                                                             Date

## 2012-01-11 ENCOUNTER — Ambulatory Visit (HOSPITAL_COMMUNITY): Payer: Self-pay | Admitting: *Deleted

## 2012-01-12 DIAGNOSIS — I10 Essential (primary) hypertension: Secondary | ICD-10-CM | POA: Insufficient documentation

## 2012-01-12 DIAGNOSIS — K219 Gastro-esophageal reflux disease without esophagitis: Secondary | ICD-10-CM | POA: Insufficient documentation

## 2012-01-13 ENCOUNTER — Ambulatory Visit (HOSPITAL_COMMUNITY): Payer: Self-pay | Admitting: Physical Therapy

## 2012-01-25 DIAGNOSIS — M8640 Chronic osteomyelitis with draining sinus, unspecified site: Secondary | ICD-10-CM

## 2012-01-25 HISTORY — DX: Chronic osteomyelitis with draining sinus, unspecified site: M86.40

## 2012-08-23 ENCOUNTER — Other Ambulatory Visit: Payer: Self-pay | Admitting: *Deleted

## 2012-08-23 DIAGNOSIS — I719 Aortic aneurysm of unspecified site, without rupture: Secondary | ICD-10-CM

## 2012-09-11 ENCOUNTER — Encounter: Payer: Self-pay | Admitting: Thoracic Surgery (Cardiothoracic Vascular Surgery)

## 2012-10-08 ENCOUNTER — Other Ambulatory Visit: Payer: Self-pay

## 2012-10-09 ENCOUNTER — Encounter: Payer: Self-pay | Admitting: Thoracic Surgery (Cardiothoracic Vascular Surgery)

## 2013-12-04 DIAGNOSIS — D649 Anemia, unspecified: Secondary | ICD-10-CM | POA: Insufficient documentation

## 2014-08-07 DIAGNOSIS — E669 Obesity, unspecified: Secondary | ICD-10-CM | POA: Insufficient documentation

## 2015-10-25 DIAGNOSIS — I7103 Dissection of thoracoabdominal aorta: Secondary | ICD-10-CM | POA: Insufficient documentation

## 2017-10-12 DIAGNOSIS — Z8614 Personal history of Methicillin resistant Staphylococcus aureus infection: Secondary | ICD-10-CM

## 2017-10-12 HISTORY — DX: Personal history of Methicillin resistant Staphylococcus aureus infection: Z86.14

## 2018-04-30 DIAGNOSIS — G546 Phantom limb syndrome with pain: Secondary | ICD-10-CM | POA: Insufficient documentation

## 2018-11-23 DIAGNOSIS — Z993 Dependence on wheelchair: Secondary | ICD-10-CM | POA: Insufficient documentation

## 2019-09-02 DIAGNOSIS — Z8619 Personal history of other infectious and parasitic diseases: Secondary | ICD-10-CM | POA: Insufficient documentation

## 2020-01-27 DIAGNOSIS — R911 Solitary pulmonary nodule: Secondary | ICD-10-CM | POA: Insufficient documentation

## 2020-07-27 DIAGNOSIS — N032 Chronic nephritic syndrome with diffuse membranous glomerulonephritis: Secondary | ICD-10-CM | POA: Insufficient documentation

## 2020-07-27 DIAGNOSIS — I712 Thoracic aortic aneurysm, without rupture, unspecified: Secondary | ICD-10-CM | POA: Insufficient documentation

## 2020-07-27 DIAGNOSIS — J302 Other seasonal allergic rhinitis: Secondary | ICD-10-CM | POA: Insufficient documentation

## 2020-07-27 DIAGNOSIS — R809 Proteinuria, unspecified: Secondary | ICD-10-CM | POA: Insufficient documentation

## 2020-07-27 DIAGNOSIS — I714 Abdominal aortic aneurysm, without rupture, unspecified: Secondary | ICD-10-CM | POA: Insufficient documentation

## 2020-08-05 ENCOUNTER — Other Ambulatory Visit: Payer: Self-pay

## 2020-08-05 ENCOUNTER — Ambulatory Visit (INDEPENDENT_AMBULATORY_CARE_PROVIDER_SITE_OTHER): Payer: 59 | Admitting: Internal Medicine

## 2020-08-05 ENCOUNTER — Encounter: Payer: Self-pay | Admitting: Internal Medicine

## 2020-08-05 VITALS — BP 134/77 | HR 54 | Temp 99.3°F | Resp 20

## 2020-08-05 DIAGNOSIS — Z89619 Acquired absence of unspecified leg above knee: Secondary | ICD-10-CM

## 2020-08-05 DIAGNOSIS — E785 Hyperlipidemia, unspecified: Secondary | ICD-10-CM

## 2020-08-05 DIAGNOSIS — Z7689 Persons encountering health services in other specified circumstances: Secondary | ICD-10-CM | POA: Diagnosis not present

## 2020-08-05 DIAGNOSIS — N186 End stage renal disease: Secondary | ICD-10-CM | POA: Insufficient documentation

## 2020-08-05 DIAGNOSIS — I739 Peripheral vascular disease, unspecified: Secondary | ICD-10-CM

## 2020-08-05 DIAGNOSIS — I10 Essential (primary) hypertension: Secondary | ICD-10-CM

## 2020-08-05 DIAGNOSIS — F32A Depression, unspecified: Secondary | ICD-10-CM | POA: Diagnosis not present

## 2020-08-05 DIAGNOSIS — Z940 Kidney transplant status: Secondary | ICD-10-CM

## 2020-08-05 NOTE — Assessment & Plan Note (Signed)
On Celexa 

## 2020-08-05 NOTE — Assessment & Plan Note (Signed)
BP Readings from Last 1 Encounters:  08/05/20 134/77   Well-controlled with Amlodipine, Coreg, HCTZ and Spironolactone Counseled for compliance with the medications Advised DASH diet and moderate exercise/walking, at least 150 mins/week

## 2020-08-05 NOTE — Patient Instructions (Signed)
Please continue taking your medications as prescribed.  Please continue to follow low sodium diet and perform exercise as tolerated.  You are being referred to Nephrology.

## 2020-08-05 NOTE — Progress Notes (Signed)
Established Patient Office Visit  Subjective:  Patient ID: Robert Campbell, male    DOB: September 29, 1970  Age: 50 y.o. MRN: [id number]  CC:  Chief Complaint  Patient presents with  . New Patient (Initial Visit)    HPI Robert Campbell is a 50 year old male with PMH of HTN, AAA s/p EVAR, PVD s/p b/l AKA, ESRD s/p kidney transplant, depression and anxiety who presents for establishing care.  Patient states that he has been feeling well overall. He wanted to have a local PCP. He has a history of kidney transplant in 03/2018 and has been following up with [facility] transplant Nephrology team. He takes his immunosuppressants regularly. He requests a local Nephrology referral.  He has AAA in 2009 and had cardiac arrest before reaching the hospital. He had complications from AAA and PVD, for which he had to get b/l AKA. He has been taking good care of himself since that episode. He is independent for his ADLs and IADLs.  Last colonoscopy in 03/2020.  Patient is up-to-date with COVID, flu and pneumonia vaccines.  Past Medical History:  Diagnosis Date  . Anxiety   . 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 Arthritis    [employer]reesia 08/03/2020  . Blindness    right eye, low vision left eye  . Blood transfusion without reported diagnosis    [employer]reesia 08/03/2020  . Chronic kidney disease    [employer]reesia 08/03/2020  . COPD (chronic obstructive pulmonary disease) ([place])    [employer]reesia 08/03/2020  . Depression   . H/O above knee amputation ([place])   . History of stroke   . Hyperlipidemia   . Hypertension   . Osteoporosis    [employer]reesia 08/03/2020  . PVD (peripheral vascular disease) ([place])     Past Surgical History:  Procedure Laterality Date  . ABDOMINAL SURGERY    . amputee      History reviewed. No pertinent family history.  Social History   Socioeconomic History  . Marital status: Single    Spouse name: Not on file  . Number of children: Not  on file  . Years of education: Not on file  . Highest education level: Not on file  Occupational History  . Not on file  Tobacco Use  . Smoking status: Never Smoker  . Smokeless tobacco: Never Used  Substance and Sexual Activity  . Alcohol use: Not Currently  . Drug use: Yes    Types: Marijuana  . Sexual activity: Not Currently  Other Topics Concern  . Not on file  Social History Narrative  . Not on file   Social Determinants of Health   Financial Resource Strain: Not on file  Food Insecurity: Not on file  Transportation Needs: Not on file  Physical Activity: Not on file  Stress: Not on file  Social Connections: Not on file  Intimate Partner Violence: Not on file    Outpatient Medications Prior to Visit  Medication Sig Dispense Refill  . amLODipine (NORVASC) 10 MG tablet Take 10 mg by mouth every morning.    . carvedilol (COREG) 25 MG tablet Take 25 mg by mouth 2 (two) times daily with a meal.    . citalopram (CELEXA) 40 MG tablet Take 40 mg by mouth daily.    . cyclobenzaprine (FLEXERIL) 10 MG tablet Take 10 mg by mouth 3 (three) times daily as needed. For  Spasms/pain    . fish oil-omega-3 fatty acids 1000 MG capsule Take  1 g by mouth 2 (two) times daily.    . hydrochlorothiazide (HYDRODIURIL) 25 MG tablet Take 25 mg by mouth daily.    Marland Kitchen losartan (COZAAR) 100 MG tablet Take 100 mg by mouth daily.    . mycophenolate (CELLCEPT) 250 MG capsule Take 250 mg by mouth 2 (two) times daily.    Marland Kitchen omeprazole (PRILOSEC) 20 MG capsule Take 20 mg by mouth 2 (two) times daily.    . polyethylene glycol (MIRALAX / GLYCOLAX) 17 g packet Take 17 g by mouth daily.    . pravastatin (PRAVACHOL) 20 MG tablet Take 20 mg by mouth daily.    . predniSONE (DELTASONE) 5 MG tablet Take 5 mg by mouth daily with breakfast.    . senna (SENOKOT) 8.6 MG tablet Take 1 tablet by mouth daily.    . sodium bicarbonate 650 MG tablet Take 650 mg by mouth in the morning and at bedtime.    Marland Kitchen spironolactone  (ALDACTONE) 25 MG tablet Take 25 mg by mouth daily.    . Tacrolimus ER 4 MG TB24 Take 4 mg by mouth daily.    Marland Kitchen HYDROcodone-acetaminophen (NORCO) 5-325 MG per tablet Take 2 tablets by mouth every 6 (six) hours as needed. For pain    . lisinopril (PRINIVIL,ZESTRIL) 40 MG tablet Take 80 mg by mouth daily.     No facility-administered medications prior to visit.    No Known Allergies  ROS Review of Systems  Constitutional: Negative for chills and fever.  HENT: Negative for congestion and sore throat.   Eyes: Negative for pain and discharge.  Respiratory: Negative for cough and shortness of breath.   Cardiovascular: Negative for chest pain and palpitations.  Gastrointestinal: Negative for constipation, diarrhea, nausea and vomiting.  Endocrine: Negative for polydipsia and polyuria.  Genitourinary: Negative for dysuria and hematuria.  Musculoskeletal: Positive for back pain. Negative for neck pain and neck stiffness.  Skin: Negative for rash.  Neurological: Negative for dizziness, weakness, numbness and headaches.  Psychiatric/Behavioral: Negative for agitation and behavioral problems.      Objective:    Physical Exam Vitals reviewed.  Constitutional:      General: He is not in acute distress.    Appearance: He is not diaphoretic.  HENT:     Head: Normocephalic and atraumatic.     Nose: Nose normal.     Mouth/Throat:     Mouth: Mucous membranes are moist.  Eyes:     General: No scleral icterus.    Extraocular Movements: Extraocular movements intact.     Pupils: Pupils are equal, round, and reactive to light.  Cardiovascular:     Rate and Rhythm: Normal rate and regular rhythm.     Pulses: Normal pulses.     Heart sounds: Normal heart sounds. No murmur heard.   Pulmonary:     Breath sounds: Normal breath sounds. No wheezing or rales.  Abdominal:     Palpations: Abdomen is soft.     Tenderness: There is no abdominal tenderness.     Comments: Vertical scar present, C/D/I   Musculoskeletal:     Cervical back: Neck supple. No tenderness.     Comments: S/p b/l AKA Left forearm AV fistula present  Skin:    General: Skin is warm.     Findings: No rash.  Neurological:     General: No focal deficit present.     Mental Status: He is alert and oriented to person, place, and time.     Cranial Nerves: No cranial  nerve deficit.     Sensory: No sensory deficit.  Psychiatric:        Mood and Affect: Mood normal.        Behavior: Behavior normal.     BP 134/77   Pulse (!) 54   Temp 99.3 F (37.4 C)   Resp 20   SpO2 95%  Wt Readings from Last 3 Encounters:  10/13/11 225 lb (102.1 kg)  09/29/11 225 lb (102.1 kg)     Health Maintenance Due  Topic Date Due  . Hepatitis C Screening  Never done  . COVID-19 Vaccine (1) Never done  . HIV Screening  Never done  . TETANUS/TDAP  Never done  . COLONOSCOPY (Pts 45-18yr Insurance coverage will need to be confirmed)  Never done  . INFLUENZA VACCINE  02/16/2020    There are no preventive care reminders to display for this patient.  No results found for: TSH Lab Results  Component Value Date   WBC 10.0 09/29/2011   HGB 11.8 (L) 09/29/2011   HCT 36.8 (L) 09/29/2011   MCV 88.2 09/29/2011   PLT 228 09/29/2011   Lab Results  Component Value Date   NA 138 09/29/2011   K 3.4 (L) 09/29/2011   CO2 29 09/29/2011   GLUCOSE 88 09/29/2011   BUN 33 (H) 09/29/2011   CREATININE 1.90 (H) 09/29/2011   CALCIUM 9.6 09/29/2011   No results found for: CHOL No results found for: HDL No results found for: LDLCALC No results found for: TRIG No results found for: CHOLHDL No results found for: HGBA1C    Assessment & Plan:   Problem List Items Addressed This Visit      Cardiovascular and Mediastinum   Hypertension    BP Readings from Last 1 Encounters:  08/05/20 134/77   Well-controlled with Amlodipine, Coreg, HCTZ and Spironolactone Counseled for compliance with the medications Advised DASH diet and moderate  exercise/walking, at least 150 mins/week      Relevant Medications   pravastatin (PRAVACHOL) 20 MG tablet   losartan (COZAAR) 100 MG tablet   spironolactone (ALDACTONE) 25 MG tablet   PVD (peripheral vascular disease) (HCC)    S/p b/l AKA      Relevant Medications   pravastatin (PRAVACHOL) 20 MG tablet   losartan (COZAAR) 100 MG tablet   spironolactone (ALDACTONE) 25 MG tablet     Genitourinary   ESRD (end stage renal disease) (HCC)    S/p kidney transplant On Tacrolimus, Mycophenolate and Prednisone Follows up with [facility]ney Transplant team Requests a local Nephrology referral - provided.        Other   Depression    On Celexa      H/O above knee amputation (HCC)   Hyperlipidemia    On Pravastatin and Omega-3      Relevant Medications   pravastatin (PRAVACHOL) 20 MG tablet   losartan (COZAAR) 100 MG tablet   spironolactone (ALDACTONE) 25 MG tablet   S/P kidney transplant   Relevant Medications   predniSONE (DELTASONE) 5 MG tablet   Tacrolimus ER 4 MG TB24   mycophenolate (CELLCEPT) 250 MG capsule    Other Visit Diagnoses    Encounter to establish care    -  Primary   Kidney transplanted       Relevant Medications   predniSONE (DELTASONE) 5 MG tablet   Tacrolimus ER 4 MG TB24   mycophenolate (CELLCEPT) 250 MG capsule   Other Relevant Orders   Ambulatory referral to Nephrology  No orders of the defined types were placed in this encounter.   Follow-up: Return in about 4 months (around 12/03/2020).    Lindell Spar, MD

## 2020-08-05 NOTE — Assessment & Plan Note (Signed)
On Pravastatin and Omega-3

## 2020-08-05 NOTE — Assessment & Plan Note (Signed)
S/p b/l AKA

## 2020-08-05 NOTE — Assessment & Plan Note (Signed)
Care established Previous chart reviewed History and medications reviewed with the patient 

## 2020-08-05 NOTE — Assessment & Plan Note (Signed)
S/p kidney transplant On Tacrolimus, Mycophenolate and Prednisone Follows up with [facility] Transplant team Requests a local Nephrology referral - provided.

## 2020-08-13 ENCOUNTER — Other Ambulatory Visit: Payer: Self-pay

## 2020-08-13 ENCOUNTER — Ambulatory Visit (INDEPENDENT_AMBULATORY_CARE_PROVIDER_SITE_OTHER): Payer: 59

## 2020-08-13 DIAGNOSIS — Z0000 Encounter for general adult medical examination without abnormal findings: Secondary | ICD-10-CM

## 2020-08-13 NOTE — Progress Notes (Signed)
Subjective:   Robert Campbell is a 50 y.o. male who presents for Medicare Annual/Subsequent preventive examination.       Objective:    There were no vitals filed for this visit. There is no height or weight on file to calculate BMI.  No flowsheet data found.  Current Medications (verified) Outpatient Encounter Medications as of 08/13/2020  Medication Sig  . amLODipine (NORVASC) 10 MG tablet Take 10 mg by mouth every morning.  . carvedilol (COREG) 25 MG tablet Take 25 mg by mouth 2 (two) times daily with a meal.  . citalopram (CELEXA) 40 MG tablet Take 40 mg by mouth daily.  . cyclobenzaprine (FLEXERIL) 10 MG tablet Take 10 mg by mouth 3 (three) times daily as needed. For  Spasms/pain  . fish oil-omega-3 fatty acids 1000 MG capsule Take 1 g by mouth 2 (two) times daily.  . hydrochlorothiazide (HYDRODIURIL) 25 MG tablet Take 25 mg by mouth daily.  Marland Kitchen losartan (COZAAR) 100 MG tablet Take 100 mg by mouth daily.  . mycophenolate (CELLCEPT) 250 MG capsule Take 250 mg by mouth 2 (two) times daily.  Marland Kitchen omeprazole (PRILOSEC) 20 MG capsule Take 20 mg by mouth 2 (two) times daily.  . polyethylene glycol (MIRALAX / GLYCOLAX) 17 g packet Take 17 g by mouth daily.  . pravastatin (PRAVACHOL) 20 MG tablet Take 20 mg by mouth daily.  . predniSONE (DELTASONE) 5 MG tablet Take 5 mg by mouth daily with breakfast.  . senna (SENOKOT) 8.6 MG tablet Take 1 tablet by mouth daily.  . sodium bicarbonate 650 MG tablet Take 650 mg by mouth in the morning and at bedtime.  Marland Kitchen spironolactone (ALDACTONE) 25 MG tablet Take 25 mg by mouth daily.  . Tacrolimus ER 4 MG TB24 Take 4 mg by mouth daily.   No facility-administered encounter medications on file as of 08/13/2020.    Allergies (verified) Patient has no known allergies.   History: Past Medical History:  Diagnosis Date  . Anxiety   . 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 Arthritis    Phreesia  08/03/2020  . Blindness    right eye, low vision left eye  . Blood transfusion without reported diagnosis    Phreesia 08/03/2020  . Chronic kidney disease    Phreesia 08/03/2020  . COPD (chronic obstructive pulmonary disease) ([place])    Phreesia 08/03/2020  . Depression   . H/O above knee amputation ([place])   . History of stroke   . Hyperlipidemia   . Hypertension   . Osteoporosis    Phreesia 08/03/2020  . PVD (peripheral vascular disease) ([place])    Past Surgical History:  Procedure Laterality Date  . ABDOMINAL SURGERY    . amputee     No family history on file. Social History   Socioeconomic History  . Marital status: Single    Spouse name: Not on file  . Number of children: Not on file  . Years of education: Not on file  . Highest education level: Not on file  Occupational History  . Not on file  Tobacco Use  . Smoking status: Never Smoker  . Smokeless tobacco: Never Used  Substance and Sexual Activity  . Alcohol use: Not Currently  . Drug use: Yes    Types: Marijuana  . Sexual activity: Not Currently  Other Topics Concern  . Not on file  Social History Narrative  . Not on file  Social Determinants of Health   Financial Resource Strain: Not on file  Food Insecurity: Not on file  Transportation Needs: Not on file  Physical Activity: Not on file  Stress: Not on file  Social Connections: Not on file    Tobacco Counseling Counseling given: Not Answered   Clinical Intake:                 Diabetic? No          Activities of Daily Living No flowsheet data found.  Patient Care Team: Lindell Spar, MD as PCP - General (Internal Medicine)  Indicate any recent Medical Services you may have received from other than [facility] providers in the past year (date may be approximate).     Assessment:   This is a routine wellness examination for [facility].  Hearing/Vision screen No exam data present  Dietary issues and exercise activities  discussed:    Goals   None    Depression Screen PHQ 2/9 Scores 08/05/2020  PHQ - 2 Score 0    Fall Risk Fall Risk  08/05/2020  Falls in the past year? 0  Number falls in past yr: 0  Injury with Fall? 0  Risk for fall due to : No Fall Risks  Follow up Falls evaluation completed    [place]:  Any stairs in or around the home? No  If so, are there any without handrails? No  Home free of loose throw rugs in walkways, pet beds, electrical cords, etc? Yes  Adequate lighting in your home to reduce risk of falls? Yes   ASSISTIVE DEVICES UTILIZED TO PREVENT FALLS:  Life alert? No  Use of a cane, walker or w/c? Yes  Grab bars in the bathroom? No  Shower chair or bench in shower? Yes  Elevated toilet seat or a handicapped toilet? Yes   TIMED UP AND GO:  Was the test performed? No .          Immunizations  There is no immunization history on file for this patient.  TDAP status: Due, Education has been provided regarding the importance of this vaccine. Advised may receive this vaccine at local pharmacy or Health Dept. Aware to provide a copy of the vaccination record if obtained from local pharmacy or Health Dept. Verbalized acceptance and understanding.  Flu Vaccine status: Up to date  Pneumococcal vaccine status: Up to date  Covid-19 vaccine status: Completed vaccines  Qualifies for Shingles Vaccine? No   Zostavax completed No   Shingrix Completed?: No.    Education has been provided regarding the importance of this vaccine. Patient has been advised to call insurance company to determine out of pocket expense if they have not yet received this vaccine. Advised may also receive vaccine at local pharmacy or Health Dept. Verbalized acceptance and understanding.  Screening Tests Health Maintenance  Topic Date Due  . Hepatitis C Screening  Never done  . COVID-19 Vaccine (1) Never done  . HIV Screening  Never done  . TETANUS/TDAP  Never  done  . COLONOSCOPY (Pts 45-23yr Insurance coverage will need to be confirmed)  Never done  . INFLUENZA VACCINE  02/16/2020    Health Maintenance  Health Maintenance Due  Topic Date Due  . Hepatitis C Screening  Never done  . COVID-19 Vaccine (1) Never done  . HIV Screening  Never done  . TETANUS/TDAP  Never done  . COLONOSCOPY (Pts 45-421yrInsurance coverage will need to be confirmed)  Never done  . INFLUENZA VACCINE  02/16/2020    Colorectal cancer screening: Type of screening: Colonoscopy. Completed 5. Repeat every 5 years  Lung Cancer Screening: (Low Dose CT Chest recommended if Age 58-80 years, 30 pack-year currently smoking OR have quit w/in 15years.) does not qualify.    Additional Screening:  Hepatitis C Screening: does qualify; has not done yet.   Vision Screening: Recommended annual ophthalmology exams for early detection of glaucoma and other disorders of the eye. Is the patient up to date with their annual eye exam?  Yes  Who is the provider or what is the name of the office in which the patient attends annual eye exams? [facility]   If pt is not established with a provider, would they like to be referred to a provider to establish care? No .   Dental Screening: Recommended annual dental exams for proper oral hygiene  Community Resource Referral / Chronic Care Management: CRR required this visit?  No   CCM required this visit?  No      Plan:     I have personally reviewed and noted the following in the patient's chart:   . Medical and social history . Use of alcohol, tobacco or illicit drugs  . Current medications and supplements . Functional ability and status . Nutritional status . Physical activity . Advanced directives . List of other physicians . Hospitalizations, surgeries, and ER visits in previous 12 months . Vitals . Screenings to include cognitive, depression, and falls . Referrals and appointments  In addition, I have  reviewed and discussed with patient certain preventive protocols, quality metrics, and best practice recommendations. A written personalized care plan for preventive services as well as general preventive health recommendations were provided to patient.     Lonn Georgia, LPN   [id number]   Nurse Notes: AWV conducted over the phone with pt consent to televisit via audio. Pt was in the home at the time of call, and provider in the office. Call took approx. 20 min.

## 2020-08-13 NOTE — Patient Instructions (Signed)
Robert Campbell , Thank you for taking time to come for your Medicare Wellness Visit. I appreciate your ongoing commitment to your health goals. Please review the following plan we discussed and let me know if I can assist you in the future.   Screening recommendations/referrals: Colonoscopy: Complete 03/2020  Recommended yearly ophthalmology/optometry visit for glaucoma screening and checkup Recommended yearly dental visit for hygiene and checkup  Vaccinations: Influenza vaccine: Complete  Pneumococcal vaccine: Complete  Tdap vaccine: DUE  Shingles vaccine: Not eligible at this time.     Advanced directives: N/A   Conditions/risks identified: None   Next appointment: 12/03/20 @ 1:40pm with Dr. Posey Pronto, MD   Preventive Care 40-64 Years, Male Preventive care refers to lifestyle choices and visits with your health care provider that can promote health and wellness. What does preventive care include?  A yearly physical exam. This is also called an annual well check.  Dental exams once or twice a year.  Routine eye exams. Ask your health care provider how often you should have your eyes checked.  Personal lifestyle choices, including:  Daily care of your teeth and gums.  Regular physical activity.  Eating a healthy diet.  Avoiding tobacco and drug use.  Limiting alcohol use.  Practicing safe sex.  Taking low-dose aspirin every day starting at age 9. What happens during an annual well check? The services and screenings done by your health care provider during your annual well check will depend on your age, overall health, lifestyle risk factors, and family history of disease. Counseling  Your health care provider may ask you questions about your:  Alcohol use.  Tobacco use.  Drug use.  Emotional well-being.  Home and relationship well-being.  Sexual activity.  Eating habits.  Work and work Statistician. Screening  You may have the following tests or  measurements:  Height, weight, and BMI.  Blood pressure.  Lipid and cholesterol levels. These may be checked every 5 years, or more frequently if you are over 53 years old.  Skin check.  Lung cancer screening. You may have this screening every year starting at age 41 if you have a 30-pack-year history of smoking and currently smoke or have quit within the past 15 years.  Fecal occult blood test (FOBT) of the stool. You may have this test every year starting at age 29.  Flexible sigmoidoscopy or colonoscopy. You may have a sigmoidoscopy every 5 years or a colonoscopy every 10 years starting at age 77.  Prostate cancer screening. Recommendations will vary depending on your family history and other risks.  Hepatitis C blood test.  Hepatitis B blood test.  Sexually transmitted disease (STD) testing.  Diabetes screening. This is done by checking your blood sugar (glucose) after you have not eaten for a while (fasting). You may have this done every 1-3 years. Discuss your test results, treatment options, and if necessary, the need for more tests with your health care provider. Vaccines  Your health care provider may recommend certain vaccines, such as:  Influenza vaccine. This is recommended every year.  Tetanus, diphtheria, and acellular pertussis (Tdap, Td) vaccine. You may need a Td booster every 10 years.  Zoster vaccine. You may need this after age 73.  Pneumococcal 13-valent conjugate (PCV13) vaccine. You may need this if you have certain conditions and have not been vaccinated.  Pneumococcal polysaccharide (PPSV23) vaccine. You may need one or two doses if you smoke cigarettes or if you have certain conditions. Talk to your health care provider  about which screenings and vaccines you need and how often you need them. This information is not intended to replace advice given to you by your health care provider. Make sure you discuss any questions you have with your health care  provider. Document Released: 07/31/2015 Document Revised: 03/23/2016 Document Reviewed: 05/05/2015 Elsevier Interactive Patient Education  2017 [place] Prevention in the Home Falls can cause injuries. They can happen to people of all ages. There are many things you can do to make your home safe and to help prevent falls. What can I do on the outside of my home?  Regularly fix the edges of walkways and driveways and fix any cracks.  Remove anything that might make you trip as you walk through a door, such as a raised step or threshold.  Trim any bushes or trees on the path to your home.  Use bright outdoor lighting.  Clear any walking paths of anything that might make someone trip, such as rocks or tools.  Regularly check to see if handrails are loose or broken. Make sure that both sides of any steps have handrails.  Any raised decks and porches should have guardrails on the edges.  Have any leaves, snow, or ice cleared regularly.  Use sand or salt on walking paths during winter.  Clean up any spills in your garage right away. This includes oil or grease spills. What can I do in the bathroom?  Use night lights.  Install grab bars by the toilet and in the tub and shower. Do not use towel bars as grab bars.  Use non-skid mats or decals in the tub or shower.  If you need to sit down in the shower, use a plastic, non-slip stool.  Keep the floor dry. Clean up any water that spills on the floor as soon as it happens.  Remove soap buildup in the tub or shower regularly.  Attach bath mats securely with double-sided non-slip rug tape.  Do not have throw rugs and other things on the floor that can make you trip. What can I do in the bedroom?  Use night lights.  Make sure that you have a light by your bed that is easy to reach.  Do not use any sheets or blankets that are too big for your bed. They should not hang down onto the floor.  Have a firm chair that has  side arms. You can use this for support while you get dressed.  Do not have throw rugs and other things on the floor that can make you trip. What can I do in the kitchen?  Clean up any spills right away.  Avoid walking on wet floors.  Keep items that you use a lot in easy-to-reach places.  If you need to reach something above you, use a strong step stool that has a grab bar.  Keep electrical cords out of the way.  Do not use floor polish or wax that makes floors slippery. If you must use wax, use non-skid floor wax.  Do not have throw rugs and other things on the floor that can make you trip. What can I do with my stairs?  Do not leave any items on the stairs.  Make sure that there are handrails on both sides of the stairs and use them. Fix handrails that are broken or loose. Make sure that handrails are as long as the stairways.  Check any carpeting to make sure that it is firmly attached to  the stairs. Fix any carpet that is loose or worn.  Avoid having throw rugs at the top or bottom of the stairs. If you do have throw rugs, attach them to the floor with carpet tape.  Make sure that you have a light switch at the top of the stairs and the bottom of the stairs. If you do not have them, ask someone to add them for you. What else can I do to help prevent falls?  Wear shoes that:  Do not have high heels.  Have rubber bottoms.  Are comfortable and fit you well.  Are closed at the toe. Do not wear sandals.  If you use a stepladder:  Make sure that it is fully opened. Do not climb a closed stepladder.  Make sure that both sides of the stepladder are locked into place.  Ask someone to hold it for you, if possible.  Clearly mark and make sure that you can see:  Any grab bars or handrails.  First and last steps.  Where the edge of each step is.  Use tools that help you move around (mobility aids) if they are needed. These  include:  Canes.  Walkers.  Scooters.  Crutches.  Turn on the lights when you go into a dark area. Replace any light bulbs as soon as they burn out.  Set up your furniture so you have a clear path. Avoid moving your furniture around.  If any of your floors are uneven, fix them.  If there are any pets around you, be aware of where they are.  Review your medicines with your doctor. Some medicines can make you feel dizzy. This can increase your chance of falling. Ask your doctor what other things that you can do to help prevent falls. This information is not intended to replace advice given to you by your health care provider. Make sure you discuss any questions you have with your health care provider. Document Released: 04/30/2009 Document Revised: 12/10/2015 Document Reviewed: 08/08/2014 Elsevier Interactive Patient Education  2017 [employer].

## 2020-10-12 DIAGNOSIS — T8619 Other complication of kidney transplant: Secondary | ICD-10-CM | POA: Insufficient documentation

## 2020-10-12 DIAGNOSIS — Z79899 Other long term (current) drug therapy: Secondary | ICD-10-CM | POA: Insufficient documentation

## 2020-12-03 ENCOUNTER — Ambulatory Visit: Payer: 59 | Admitting: Internal Medicine

## 2020-12-10 ENCOUNTER — Ambulatory Visit (INDEPENDENT_AMBULATORY_CARE_PROVIDER_SITE_OTHER): Payer: 59 | Admitting: Internal Medicine

## 2020-12-10 ENCOUNTER — Other Ambulatory Visit: Payer: Self-pay

## 2020-12-10 ENCOUNTER — Encounter: Payer: Self-pay | Admitting: Internal Medicine

## 2020-12-10 VITALS — BP 128/76 | HR 70 | Temp 98.5°F | Resp 18 | Ht 77.0 in

## 2020-12-10 DIAGNOSIS — Z940 Kidney transplant status: Secondary | ICD-10-CM

## 2020-12-10 DIAGNOSIS — Z89619 Acquired absence of unspecified leg above knee: Secondary | ICD-10-CM | POA: Diagnosis not present

## 2020-12-10 DIAGNOSIS — I10 Essential (primary) hypertension: Secondary | ICD-10-CM | POA: Diagnosis not present

## 2020-12-10 DIAGNOSIS — Z993 Dependence on wheelchair: Secondary | ICD-10-CM | POA: Diagnosis not present

## 2020-12-10 NOTE — Assessment & Plan Note (Signed)
BP Readings from Last 1 Encounters:  12/10/20 128/76   Well-controlled with Coreg and Spironolactone, managed by Nephrology Counseled for compliance with the medications Advised DASH diet and moderate exercise/walking, at least 150 mins/week

## 2020-12-10 NOTE — Patient Instructions (Signed)
Please continue taking medications as prescribed.  Please continue to follow low salt and low carb diet.  Continue to follow up with Dr Theador Hawthorne and [facility].

## 2020-12-10 NOTE — Assessment & Plan Note (Signed)
S/p kidney transplant On Tacrolimus, Mycophenolate and Prednisone Follows up with [facility] team

## 2020-12-10 NOTE — Assessment & Plan Note (Signed)
S/p b/l AKA Uses power wheelchair Requires cushions to avoid pressure ulcers

## 2020-12-10 NOTE — Progress Notes (Signed)
Established Patient Office Visit  Subjective:  Patient ID: Robert Campbell, male    DOB: Dec 25, 1970  Age: 50 y.o. MRN: [id number]  CC:  Chief Complaint  Patient presents with  . Follow-up    4 month follow up needs a cushion for power wheel chair goes to see pain management and they will order this as this is special order     HPI Robert Campbell  is a 50 year old male with PMH of HTN, AAA s/p EVAR, PVD s/p b/l AKA, ESRD s/p kidney transplant, depression and anxiety who presents for follow up of his chronic medical conditions.  S/p kidney transplant: He has been following up with Dr Theador Hawthorne. He also follows up with [facility] transplant team. He recently had his AV fistula removed.  HTN: BP is well-controlled. Takes medications regularly. Patient denies headache, dizziness, chest pain, dyspnea or palpitations.  He has been having pain in the lower back and scrotal area due to the cushion issue with his powerchair. He is going to see Pain specialist for it and get it approved.  Past Medical History:  Diagnosis Date  . Anxiety   . 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 Arthritis    Phreesia 08/03/2020  . Blindness    right eye, low vision left eye  . Blood transfusion without reported diagnosis    Phreesia 08/03/2020  . Chronic kidney disease    Phreesia 08/03/2020  . Chronic osteomyelitis with draining sinus ([place]) 01/25/2012  . COPD (chronic obstructive pulmonary disease) ([place])    Phreesia 08/03/2020  . Depression   . H/O above knee amputation ([place])   . History of stroke   . Hyperlipidemia   . Hypertension   . Osteoporosis    Phreesia 08/03/2020  . Personal history of Methicillin resistant Staphylococcus aureus infection 10/12/2017  . PVD (peripheral vascular disease) ([place])     Past Surgical History:  Procedure Laterality Date  . ABDOMINAL SURGERY    . amputee      History reviewed. No pertinent family  history.  Social History   Socioeconomic History  . Marital status: Single    Spouse name: Not on file  . Number of children: Not on file  . Years of education: Not on file  . Highest education level: Not on file  Occupational History  . Not on file  Tobacco Use  . Smoking status: Never Smoker  . Smokeless tobacco: Never Used  Substance and Sexual Activity  . Alcohol use: Not Currently  . Drug use: Yes    Types: Marijuana  . Sexual activity: Not Currently  Other Topics Concern  . Not on file  Social History Narrative  . Not on file   Social Determinants of Health   Financial Resource Strain: Low Risk   . Difficulty of Paying Living Expenses: Not hard at all  Food Insecurity: No Food Insecurity  . Worried About Charity fundraiser in the Last Year: Never true  . Ran Out of Food in the Last Year: Never true  Transportation Needs: No Transportation Needs  . Lack of Transportation (Medical): No  . Lack of Transportation (Non-Medical): No  Physical Activity: Insufficiently Active  . Days of Exercise per Week: 3 days  . Minutes of Exercise per Session: 30 min  Stress: No Stress Concern Present  . Feeling of Stress : Not at all  Social Connections: Socially Isolated  . Frequency of Communication with Friends  and Family: Three times a week  . Frequency of Social Gatherings with Friends and Family: Once a week  . Attends Religious Services: Never  . Active Member of Clubs or Organizations: No  . Attends Archivist Meetings: Never  . Marital Status: Never married  Intimate Partner Violence: Not At Risk  . Fear of Current or Ex-Partner: No  . Emotionally Abused: No  . Physically Abused: No  . Sexually Abused: No    Outpatient Medications Prior to Visit  Medication Sig Dispense Refill  . carvedilol (COREG) 25 MG tablet Take 25 mg by mouth 2 (two) times daily with a meal.    . cloNIDine (CATAPRES - DOSED IN MG/24 HR) 0.3 mg/24hr patch Place 0.3 mg onto the skin  once a week.    . losartan (COZAAR) 100 MG tablet Take 100 mg by mouth daily.    . mycophenolate (CELLCEPT) 250 MG capsule Take 250 mg by mouth 2 (two) times daily.    Marland Kitchen omeprazole (PRILOSEC) 20 MG capsule Take 20 mg by mouth 2 (two) times daily.    . pravastatin (PRAVACHOL) 20 MG tablet Take 20 mg by mouth daily.    . predniSONE (DELTASONE) 5 MG tablet Take 5 mg by mouth daily with breakfast.    . senna (SENOKOT) 8.6 MG tablet Take 1 tablet by mouth daily.    . sodium bicarbonate 650 MG tablet Take 650 mg by mouth in the morning and at bedtime.    Marland Kitchen spironolactone (ALDACTONE) 25 MG tablet Take 25 mg by mouth daily.    . Tacrolimus ER 4 MG TB24 Take 4 mg by mouth daily.    Marland Kitchen amLODipine (NORVASC) 10 MG tablet Take 10 mg by mouth every morning.    . citalopram (CELEXA) 40 MG tablet Take 40 mg by mouth daily.    . cyclobenzaprine (FLEXERIL) 10 MG tablet Take 10 mg by mouth 3 (three) times daily as needed. For  Spasms/pain    . fish oil-omega-3 fatty acids 1000 MG capsule Take 1 g by mouth 2 (two) times daily.    . hydrochlorothiazide (HYDRODIURIL) 25 MG tablet Take 25 mg by mouth daily.    . polyethylene glycol (MIRALAX / GLYCOLAX) 17 g packet Take 17 g by mouth daily.     No facility-administered medications prior to visit.    No Known Allergies  ROS Review of Systems  Constitutional: Negative for chills and fever.  HENT: Negative for congestion and sore throat.   Eyes: Negative for pain and discharge.  Respiratory: Negative for cough and shortness of breath.   Cardiovascular: Negative for chest pain and palpitations.  Gastrointestinal: Negative for constipation, diarrhea, nausea and vomiting.  Endocrine: Negative for polydipsia and polyuria.  Genitourinary: Negative for dysuria and hematuria.  Musculoskeletal: Positive for back pain. Negative for neck pain and neck stiffness.  Skin: Negative for rash.  Neurological: Negative for dizziness, weakness, numbness and headaches.   Psychiatric/Behavioral: Negative for agitation and behavioral problems.      Objective:    Physical Exam Vitals reviewed.  Constitutional:      General: He is not in acute distress.    Appearance: He is not diaphoretic.  HENT:     Head: Normocephalic and atraumatic.     Nose: Nose normal.     Mouth/Throat:     Mouth: Mucous membranes are moist.  Eyes:     General: No scleral icterus.    Extraocular Movements: Extraocular movements intact.     Pupils: Pupils  are equal, round, and reactive to light.  Cardiovascular:     Rate and Rhythm: Normal rate and regular rhythm.     Pulses: Normal pulses.     Heart sounds: Normal heart sounds. No murmur heard.   Pulmonary:     Breath sounds: Normal breath sounds. No wheezing or rales.  Abdominal:     Palpations: Abdomen is soft.     Tenderness: There is no abdominal tenderness.     Comments: Vertical scar present, C/D/I  Musculoskeletal:     Cervical back: Neck supple. No tenderness.     Comments: S/p b/l AKA Left forearm AV fistula removed now  Skin:    General: Skin is warm.     Findings: No rash.  Neurological:     General: No focal deficit present.     Mental Status: He is alert and oriented to person, place, and time.     Cranial Nerves: No cranial nerve deficit.     Sensory: No sensory deficit.  Psychiatric:        Mood and Affect: Mood normal.        Behavior: Behavior normal.     BP 128/76 (BP Location: Right Arm, Patient Position: Sitting, Cuff Size: Normal)   Pulse 70   Temp 98.5 F (36.9 C) (Oral)   Resp 18   Ht '6\' 5"'$  (1.956 m)   SpO2 98%   BMI 26.68 kg/m  Wt Readings from Last 3 Encounters:  10/13/11 225 lb (102.1 kg)  09/29/11 225 lb (102.1 kg)     Health Maintenance Due  Topic Date Due  . HIV Screening  Never done  . TETANUS/TDAP  Never done  . COVID-19 Vaccine (4 - Booster for Moderna series) 06/05/2020    There are no preventive care reminders to display for this patient.  No results  found for: TSH Lab Results  Component Value Date   WBC 10.0 09/29/2011   HGB 11.8 (L) 09/29/2011   HCT 36.8 (L) 09/29/2011   MCV 88.2 09/29/2011   PLT 228 09/29/2011   Lab Results  Component Value Date   NA 138 09/29/2011   K 3.4 (L) 09/29/2011   CO2 29 09/29/2011   GLUCOSE 88 09/29/2011   BUN 33 (H) 09/29/2011   CREATININE 1.90 (H) 09/29/2011   CALCIUM 9.6 09/29/2011   No results found for: CHOL No results found for: HDL No results found for: LDLCALC No results found for: TRIG No results found for: CHOLHDL No results found for: HGBA1C    Assessment & Plan:   Problem List Items Addressed This Visit      Cardiovascular and Mediastinum   Primary hypertension - Primary    BP Readings from Last 1 Encounters:  12/10/20 128/76   Well-controlled with Coreg and Spironolactone, managed by Nephrology Counseled for compliance with the medications Advised DASH diet and moderate exercise/walking, at least 150 mins/week      Relevant Medications   cloNIDine (CATAPRES - DOSED IN MG/24 HR) 0.3 mg/24hr patch     Other   H/O above knee amputation (HCC)    S/p b/l AKA Uses power wheelchair Requires cushions to avoid pressure ulcers      Dependence on wheelchair    S/p b/l AKA      S/P kidney transplant    S/p kidney transplant On Tacrolimus, Mycophenolate and Prednisone Follows up with [facility] Transplant team         No orders of the defined types were placed in this  encounter.   Follow-up: Return in about 6 months (around 06/12/2021) for Annual physical.    Lindell Spar, MD

## 2020-12-10 NOTE — Assessment & Plan Note (Signed)
S/p b/l AKA

## 2020-12-16 ENCOUNTER — Other Ambulatory Visit: Payer: Self-pay | Admitting: Internal Medicine

## 2020-12-16 ENCOUNTER — Telehealth: Payer: Self-pay

## 2020-12-16 DIAGNOSIS — N644 Mastodynia: Secondary | ICD-10-CM

## 2020-12-16 NOTE — Telephone Encounter (Signed)
Pt says that since last Friday he has felt a pain around his L nipple area. It's constant and he is concerned as he has 2 aunts that have had breast cancer. He wanted to know if we could go ahead with ordering a mammogram or whatever you felt was best? He said he would come to see you as well but he needs a 5 day notice with transportation and he would like to have this taken care of as quickly as possible.

## 2020-12-16 NOTE — Telephone Encounter (Signed)
I have ordered diagnostic mammogram at [facility]

## 2020-12-17 ENCOUNTER — Other Ambulatory Visit: Payer: Self-pay

## 2020-12-17 DIAGNOSIS — N644 Mastodynia: Secondary | ICD-10-CM

## 2021-01-25 ENCOUNTER — Telehealth: Payer: 59

## 2021-01-25 NOTE — Telephone Encounter (Signed)
Tillie Rung from [facility] pre-service center called needs a prior authorization for ultra sound scheduled for 7/12.  Kendra call back # [phone] Ext [phone]

## 2021-01-25 NOTE — Telephone Encounter (Signed)
LVM for kendra the [employer] does not need precert for this procedure.

## 2021-01-26 ENCOUNTER — Ambulatory Visit (HOSPITAL_COMMUNITY): Payer: 59

## 2021-01-26 ENCOUNTER — Inpatient Hospital Stay (HOSPITAL_COMMUNITY): Admission: RE | Admit: 2021-01-26 | Payer: 59 | Source: Ambulatory Visit

## 2021-02-02 ENCOUNTER — Ambulatory Visit (HOSPITAL_COMMUNITY)
Admission: RE | Admit: 2021-02-02 | Discharge: 2021-02-02 | Disposition: A | Discharge status: Home / Self Care | Payer: 59 | Source: Ambulatory Visit | Attending: Internal Medicine | Admitting: Internal Medicine

## 2021-02-02 ENCOUNTER — Other Ambulatory Visit: Payer: Self-pay

## 2021-02-02 DIAGNOSIS — N644 Mastodynia: Secondary | ICD-10-CM | POA: Diagnosis not present

## 2021-02-11 ENCOUNTER — Encounter: Payer: Self-pay | Admitting: Internal Medicine

## 2021-02-11 ENCOUNTER — Other Ambulatory Visit: Payer: Self-pay

## 2021-02-11 ENCOUNTER — Ambulatory Visit (INDEPENDENT_AMBULATORY_CARE_PROVIDER_SITE_OTHER): Payer: 59 | Admitting: Internal Medicine

## 2021-02-11 VITALS — BP 119/77 | HR 82 | Temp 98.5°F | Resp 18

## 2021-02-11 DIAGNOSIS — Z89619 Acquired absence of unspecified leg above knee: Secondary | ICD-10-CM

## 2021-02-11 NOTE — Progress Notes (Signed)
Patient was called in for wheelchair cushion form, for which he was evaluated in the last visit.  Form filled and faxed. Copy given to the patient.

## 2021-03-01 ENCOUNTER — Telehealth: Payer: Self-pay

## 2021-03-01 ENCOUNTER — Other Ambulatory Visit: Payer: Self-pay

## 2021-03-01 ENCOUNTER — Encounter: Payer: Self-pay | Admitting: Internal Medicine

## 2021-03-01 ENCOUNTER — Telehealth (INDEPENDENT_AMBULATORY_CARE_PROVIDER_SITE_OTHER): Payer: 59 | Admitting: Internal Medicine

## 2021-03-01 DIAGNOSIS — G894 Chronic pain syndrome: Secondary | ICD-10-CM | POA: Diagnosis not present

## 2021-03-01 NOTE — Patient Instructions (Signed)
You are being referred to [facility] pain clinic.

## 2021-03-01 NOTE — Telephone Encounter (Signed)
error 

## 2021-03-01 NOTE — Progress Notes (Signed)
Virtual Visit via Telephone Note   This visit type was conducted due to national recommendations for restrictions regarding the COVID-19 Pandemic (e.g. social distancing) in an effort to limit this patient's exposure and mitigate transmission in our community.  Due to his co-morbid illnesses, this patient is at least at moderate risk for complications without adequate follow up.  This format is felt to be most appropriate for this patient at this time.  The patient did not have access to video technology/had technical difficulties with video requiring transitioning to audio format only (telephone).  All issues noted in this document were discussed and addressed.  No physical exam could be performed with this format.  Evaluation Performed:  Follow-up visit  Date:  03/01/2021   ID:  Robert Campbell, DOB 02-Jan-1971, MRN [id number]  Patient Location: Home Provider Location: Office/Clinic  Participants: Patient Location of Patient: Home Location of Provider: Telehealth Consent was obtain for visit to be over via telehealth. I verified that I am speaking with the correct person using two identifiers.  PCP:  Lindell Spar, MD   Chief Complaint:  Chronic back pain  History of Present Illness:    Robert Campbell is a 50 y.o. male who has a televisit for c/o chronic back pain radiating to b/o buttocks. He has seen pain management in [place] in the past and requests a referral. Denies any urinary or stool incontinence.  The patient does not have symptoms concerning for COVID-19 infection (fever, chills, cough, or new shortness of breath).   Past Medical, Surgical, Social History, Allergies, and Medications have been Reviewed.  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 abdominal aorta   Arthritis    Phreesia 08/03/2020   Blindness    right eye, low vision left eye   Blood transfusion without  reported diagnosis    Phreesia 08/03/2020   Chronic kidney disease    Phreesia 08/03/2020   Chronic osteomyelitis with draining sinus ([place]) 01/25/2012   COPD (chronic obstructive pulmonary disease) ([place])    Phreesia 08/03/2020   Depression    H/O above knee amputation ([place])    History of stroke    Hyperlipidemia    Hypertension    Osteoporosis    Phreesia 08/03/2020   Personal history of Methicillin resistant Staphylococcus aureus infection 10/12/2017   PVD (peripheral vascular disease) ([place])    Past Surgical History:  Procedure Laterality Date   ABDOMINAL SURGERY     amputee       Current Meds  Medication Sig   carvedilol (COREG) 25 MG tablet Take 25 mg by mouth 2 (two) times daily with a meal.   cloNIDine (CATAPRES - DOSED IN MG/24 HR) 0.3 mg/24hr patch Place 0.3 mg onto the skin once a week.   losartan (COZAAR) 100 MG tablet Take 100 mg by mouth daily.   mycophenolate (CELLCEPT) 250 MG capsule Take 250 mg by mouth 2 (two) times daily.   omeprazole (PRILOSEC) 20 MG capsule Take 20 mg by mouth 2 (two) times daily.   pravastatin (PRAVACHOL) 20 MG tablet Take 20 mg by mouth daily.   predniSONE (DELTASONE) 5 MG tablet Take 5 mg by mouth daily with breakfast.   senna (SENOKOT) 8.6 MG tablet Take 1 tablet by mouth daily.   sodium bicarbonate 650 MG tablet Take 650 mg by mouth in the morning and at bedtime.   spironolactone (ALDACTONE)  25 MG tablet Take 25 mg by mouth daily.   Tacrolimus ER 4 MG TB24 Take 4 mg by mouth daily.     Allergies:   Patient has no known allergies.   ROS:   Please see the history of present illness.     All other systems reviewed and are negative.   Labs/Other Tests and Data Reviewed:    Recent Labs: No results found for requested labs within last 8760 hours.   Recent Lipid Panel No results found for: CHOL, TRIG, HDL, CHOLHDL, LDLCALC, LDLDIRECT  Wt Readings from Last 3 Encounters:  10/13/11 225 lb (102.1 kg)  09/29/11 225 lb (102.1 kg)       ASSESSMENT & PLAN:    Chronic pain syndrome Likely lumbar radiculopathy Has underlying peripheral neuropathy as well Referred to pain clinic as per patient request  Time:   Today, I have spent 6 minutes reviewing the chart, including problem list, medications, and with the patient with telehealth technology discussing the above problems.   Medication Adjustments/Labs and Tests Ordered: Current medicines are reviewed at length with the patient today.  Concerns regarding medicines are outlined above.   Tests Ordered: Orders Placed This Encounter  Procedures   Ambulatory referral to Pain Clinic    Medication Changes: No orders of the defined types were placed in this encounter.    Note: This dictation was prepared with Dragon dictation along with smaller phrase technology. Similar sounding words can be transcribed inadequately or may not be corrected upon review. Any transcriptional errors that result from this process are unintentional.      Disposition:  Follow up  Signed, Lindell Spar, MD  03/01/2021 10:27 AM     [place]

## 2021-06-17 ENCOUNTER — Encounter: Payer: 59 | Admitting: Internal Medicine

## 2021-07-23 ENCOUNTER — Telehealth: Payer: Self-pay

## 2021-07-23 ENCOUNTER — Encounter: Payer: 59 | Admitting: Internal Medicine

## 2021-07-23 NOTE — Telephone Encounter (Signed)
Fax # [facility] in [place]

## 2021-07-23 NOTE — Telephone Encounter (Signed)
patient called having transportation issues, he will be leaving practice going to [facility] in [place] so he can drive his scooter to all his appointments.

## 2021-07-23 NOTE — Telephone Encounter (Signed)
Please take pcp off and send records if patient wishes

## 2022-02-17 IMAGING — US US BREAST*L* LIMITED INC AXILLA
1 series · 3 of 3 positions shown · non-contrast
Comparison: None.

CLINICAL DATA: 41-year-old male with tenderness involving the
retroareolar left breast. The patient is wheelchair bound and
therefore mammography was deferred.

EXAM:
ULTRASOUND OF THE LEFT BREAST

[Series 1: us breast*left* limited inc axilla · 0.07mm/px · 3 of 3 slices shown]
[im 1/3]
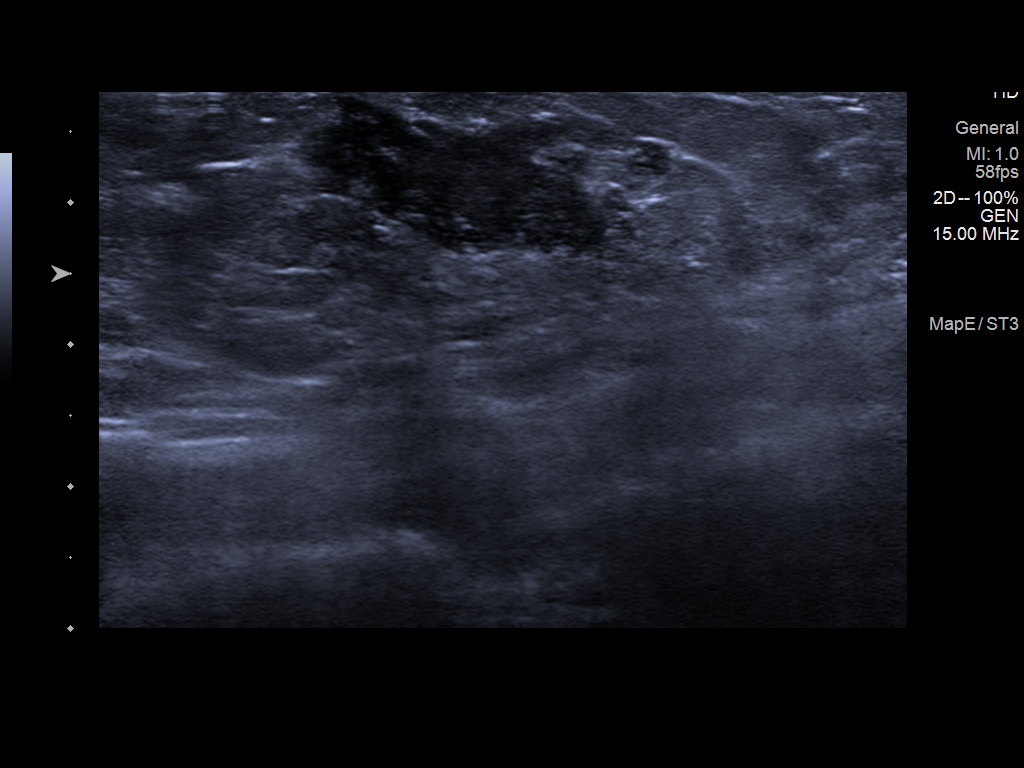
[im 2/3]
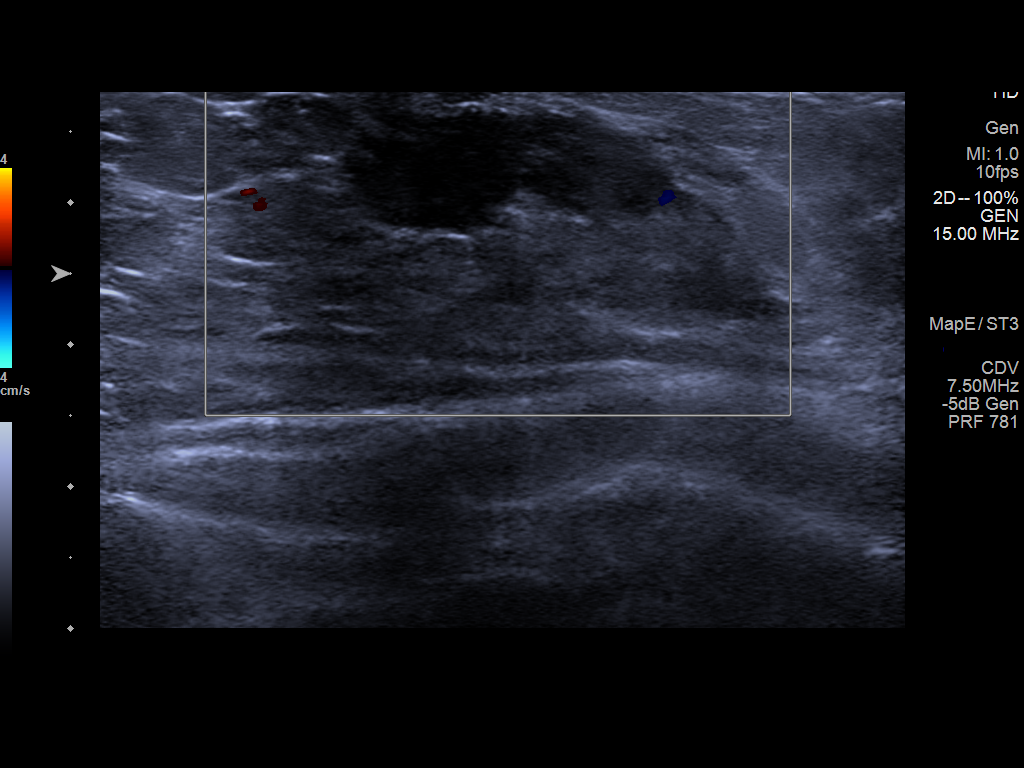
[im 3/3]
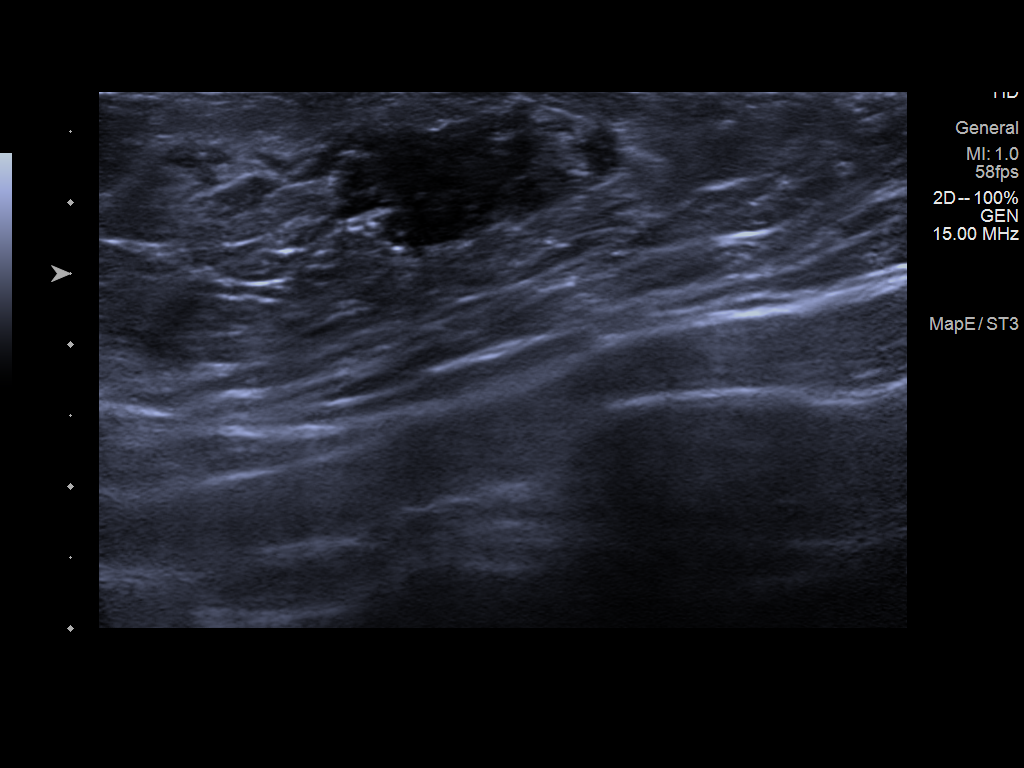

[3 of 3 positions shown; findings below may reference images not displayed]

FINDINGS: Physical examination at site of tenderness reveals a rubbery mobile
area of thickening in the immediate retroareolar left breast.

Targeted ultrasound of the retroareolar left breast was performed.
There is a hypoechoic somewhat flame shaped mass in the immediate
retroareolar left breast corresponding to the area of tenderness.
Findings are most compatible with benign gynecomastia.
IMPRESSION: Gynecomastia accounting for the left retroareolar tenderness.

RECOMMENDATION:
Clinical follow-up.

I have discussed the findings and recommendations with the patient.
If applicable, a reminder letter will be sent to the patient
regarding the next appointment.

BI-RADS CATEGORY  2: Benign.

## 2024-05-10 ENCOUNTER — Telehealth: Payer: Self-pay

## 2024-05-10 NOTE — Telephone Encounter (Signed)
 Patient called and left a message fro the front office advisng he needed to schedule an new patient appt.   I called the patient back to obtain more information and determined he is needing nephrology. I provided a number for another Nephrology office in [place]. He advised  was seeing Dr. Haven but wants to transfer. They seem him post kidney transplant and to check kidney function.
